# Patient Record
Sex: Male | Born: 1955 | Race: White | Hispanic: No | Marital: Married | Smoking: Former smoker
Health system: Southern US, Community
[De-identification: ages and names within clinical notes are randomized; demographics above are authoritative.]

## PROBLEM LIST (undated history)

## (undated) DIAGNOSIS — J939 Pneumothorax, unspecified: Secondary | ICD-10-CM

## (undated) DIAGNOSIS — C801 Malignant (primary) neoplasm, unspecified: Secondary | ICD-10-CM

## (undated) DIAGNOSIS — R911 Solitary pulmonary nodule: Secondary | ICD-10-CM

## (undated) DIAGNOSIS — Z923 Personal history of irradiation: Secondary | ICD-10-CM

## (undated) DIAGNOSIS — J449 Chronic obstructive pulmonary disease, unspecified: Secondary | ICD-10-CM

## (undated) HISTORY — PX: HERNIA REPAIR: SHX51

## (undated) HISTORY — DX: Personal history of irradiation: Z92.3

## (undated) HISTORY — PX: OTHER SURGICAL HISTORY: SHX169

## (undated) HISTORY — PX: APPENDECTOMY: SHX54

---
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
## 1979-09-13 HISTORY — PX: CHEST TUBE INSERTION: SHX231

## 1998-06-30 ENCOUNTER — Encounter: Payer: Self-pay | Admitting: Orthopedic Surgery

## 1998-06-30 ENCOUNTER — Observation Stay (HOSPITAL_COMMUNITY): Admission: RE | Admit: 1998-06-30 | Discharge: 1998-07-01 | Payer: Self-pay | Admitting: Orthopedic Surgery

## 2004-02-07 ENCOUNTER — Ambulatory Visit (HOSPITAL_COMMUNITY): Admission: RE | Admit: 2004-02-07 | Discharge: 2004-02-07 | Payer: Self-pay | Admitting: Orthopedic Surgery

## 2004-03-05 ENCOUNTER — Encounter: Admission: RE | Admit: 2004-03-05 | Discharge: 2004-03-05 | Payer: Self-pay | Admitting: Specialist

## 2004-04-13 ENCOUNTER — Inpatient Hospital Stay (HOSPITAL_COMMUNITY): Admission: RE | Admit: 2004-04-13 | Discharge: 2004-04-14 | Payer: Self-pay | Admitting: Specialist

## 2009-04-24 ENCOUNTER — Encounter: Admission: RE | Admit: 2009-04-24 | Discharge: 2009-04-24 | Payer: Self-pay | Admitting: Orthopedic Surgery

## 2009-04-30 ENCOUNTER — Encounter: Admission: RE | Admit: 2009-04-30 | Discharge: 2009-04-30 | Payer: Self-pay | Admitting: Orthopedic Surgery

## 2011-01-28 NOTE — Op Note (Signed)
NAME:  Dillon Olson, Dillon Olson                          ACCOUNT NO.:  [id number]   MEDICAL RECORD NO.:  [id number]                   PATIENT TYPE:  INP   LOCATION:  0471                                 FACILITY:  [facility]   PHYSICIAN:  Kerrin Champagne, M.D.                DATE OF BIRTH:  January 10, 1956   DATE OF PROCEDURE:  04/13/2004  DATE OF DISCHARGE:                                 OPERATIVE REPORT   PREOPERATIVE DIAGNOSES:  1. 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  2. Spondylosis left C6-7 and left C5-6.  3. Right-sided spondylosis C5-6.   POSTOPERATIVE DIAGNOSES:  1. 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  2. Spondylosis left C6-7 and left C5-6.  3. Right-sided spondylosis C5-6.   PROCEDURE:  1. Anterior cervical diskectomy with fusion of C5-6 and C6-7 with excision     of uncovertebral spurs bilateral C5-6 and bilateral C6-7.  2. Excision HNP right C6-7.  3. Internal fixation with a 43 mm DePuy 6-hole locking plate with 14 mm     screws.  4. Right iliac crest bone graft harvested through a separate incision   SURGEON:  Dr. Vira Browns   ASSISTANT:  Maud Deed, PA-C   ANESTHESIA:  GOT.  Dr. Leta Jungling.   ESTIMATED BLOOD LOSS:  50 mL.   DRAINS:  A 7 French drain, left neck to red-top tube, Foley to straight  drain.   BRIEF CLINICAL HISTORY:  The patient is a 55 year old male with a history of  neck pain, radiation into the right and left upper extremity.  He apparently  caught himself coming down a wet ladder in March of this year and began  experiencing pain into his neck with associated stiffness, radiation into  his right shoulder and arm.  The pain primarily into his ring and little  fingers.  He was seen and evaluated, placed on medications.  MRI scan  eventually done which showed disk protrusion of the right side of C6-7 with  impingement on the right C7 nerve root.  Spondylosis changes bilaterally at  C5-6.  Minimal spondylosis at C4-5.  The patient underwent attempts  at  conservative management in an attempt to return to work with persistent  neck, shoulder, and arm pain.  He has continued to require narcotic  medication.  The patient shows weakness in the C7 distribution including  finger extension and triceps weakness.  Findings on MRI scan as above.  He  is brought to the operating room to undergo a two-level anterior diskectomy  and fusion at both C5-6 and C6-7.   INTRAOPERATIVE FINDINGS:  The patient was found to have primarily  spondylosis changes bilaterally at C5-6 and C6-7, some amount of disk  material to the right side at C6-7 causing some compression of the entry  point of the C7 nerve root.   DESCRIPTION OF PROCEDURE:  After adequate general anesthesia, the patient in  a beach chair position,  five pounds cervical halter traction with a bolster  transverse at the level of the scapula.  This represented a rolled sheet.  A  bump under the right buttock to make the iliac crest prominent.  Five pounds  of cervical halter traction with the neck in slight extension was noted.  Standard preoperative antibiotics.  Standard prep with DuraPrep solution  over the anterior neck, right iliac crest.  Draped in the usual manner.  Iodine Vi-Drape was used, both for the iliac crest and the anterior neck.  The incision, in line with the patient's skin creased along the left side 2  fingerbreadths above the medial clavicle, through the skin and subcutaneous  layers using a 10 blade scalpel after infiltration with Marcaine 0.5% with  1:200,000 epinephrine.  The incision carried down to the level of the  superior fascia overlying the platysma layer.  This was incised in line with  the skin incision using electrocautery, bleeders controlled using  electrocautery.  Interval between the trachea as needed esophagus medially  and the carotid sheath laterally was then developed bluntly, first using  scissors and spreading in this plane.  The omohyoid vessel  identified and  divided.  Blunt finger dissection then used to carefully spread the plane  between the trachea, esophagus, and carotid sheath to the anterior aspect of  the cervical spine and the anterior prevertebral fascial layer.  Hand-held  Clowards inserted and electrocautery used to cauterize the prevertebral  fascia along the medial border of the longus colli muscle on the left side  at the expected C5-6 and C6-7 level.  Carotid tuberosity palpated.  Kitner  used to free the prevertebral fascia across the midline, spinal needles with  sheath left intact allowing only 1 cm to protrude, then inserted into the  disk at C5-6 and C6-7.  Intraoperative bilateral radiograph demonstrated the  needles at C5-6 and C6-7, expected levels.  Under direct visualization then  the lower needle was removed and a small portion of the disk incised with a  15 blade scalpel and pituitary used for excising this disk for continued  identification throughout the remainder of the case.  The upper needle then  removed and similarly incised, a pituitary used to excise some disk  material, continued its identification.  Hand-held Clowards to continue  retraction and Key elevator used to elevate the longus colli muscle on both  sides of the C5-6 and C6-7 levels, carefully exposing the anterior aspect of  the disk space from along both sides.  Bleeders controlled using  electrocautery.  McCullough retractor inserted for the blade beneath the  medial border of the longus colli muscle.  Exposing the anterior aspect of  the C6-7 level first.  Under loupe magnification then incision made into the  disk using a 15 blade scalpel and pituitary rongeurs.   Microcurette used to debride the endplates, both inferior aspect of C6 and  the superior aspect of C7.  Then 14 mm screw post inserted into vertebral  body of C6 and C7, distraction obtained across the intervertebral disk space.  Following debridement of the disk space  back to the posterior aspect  of the disk space and posterior annulus, operating room microscope draped  and brought into the field.  Note that while intraoperative lateral  radiograph was being obtained, incision was made over the right iliac crest,  and exposure was obtained for obtaining grafts from the right iliac crest  over the anterolateral aspect of the crest.  A 2.5  inch incision was used to  expose this area.  The incision with electrocautery onto the periosteum  superiorly and then subperiosteal dissection carried mediolateral.   Returning to the neck then, under the operating room microscope then the C6-  7 level further approached; 1 mm Kerrison used to excise posterior lip  osteophytes over the posterior-superior aspect of C7 and the posterior-  inferior aspect of C6 out to the uncovertebral joint.  Here, 1 mm Kerrison  was used to make initial entry and removal of spur, then 2 mm Kerrison's  used to resect the uncovertebral spurs, both the right side and left side.  Disk material noted to be present in subligamentous region on the right  side, and this was resected using both a combination of 1 and 2 mm  Kerrison's as well as pituitary rongeurs.  With this. then the cervical cord  was well seen. and both foramen appeared to be well decompressed, both the  right and left sides such that a nerve hook could be easily passed out the  neural foramen without difficulty.  The height of the intervertebral disk  space was measured using a sounder to 7 mm, with that measuring  approximately 18 mm.  High-speed bur was used to carefully remove  cartilaginous endplates down to bleeding bony endplate surfaces, both over  the inferior aspect of the C6 and the superior aspect of C7.  A 7 mm dual  oscillating saw was then used to incise crest on the right side, and this  was cut across its base with a quarter-inch osteotome, carefully tapering  the dimensions of the intervertebral disk  space, measured about 14-15 mm in  length.  Height of 7 mm.  Graft was then inserted and impacted into placed,  keyed deep to the anterior aspect of the cortex of the anterior portion of  the disk space at C6-7.  Distraction removed and the screw posts removed  from the C7 vertebral body, bone wax applied to the screw post hole.  The  McCullough retractor then re-placed at the C5-6 level, a screw post inserted  into the vertebral body of C5.  Distraction obtained across the disk space  and disk then incised anteriorly with a 15 blade scalpel, 3 mm and 2 mm  Kerrison used to debride anterior osteophytes and the anterior annulus,  micro pituitaries as well as microcurettes used to debride the __________  endplate in the inferior aspect of C5 and the superior aspect of the C7 back  to the posterior annulus.  Posterior annulus then debrided using curettage  as well as 1 mm and 2 mm Kerrison's.  Posterior lip osteophytes resected using 1 mm Kerrison's out to the neural foramen in both right and left side  where these osteophytes were then resected using 1 and 2 mm Kerrison's until  the C6 nerve root was noted to be exiting without further compromise.  Both  the right and left foramen were opened this and to provide decompression in  both C6 nerve roots.  Irrigation was performed, the endplates then carefully  curettaged using microcurettes and then high-speed bur down to bleeding  cortical endplates.  Height of the intervertebral disk space then measured  at 7 mm and depth measured at 18 mm using Cloward depth gauge.  Bleeding  controlled using bipolar electrocautery and thrombin-soaked Gelfoam.  Graft  then harvested from the right anterior iliac crest using a 7 mm dual  oscillating saw dividing the base with a quarter-inch  osteotome  appropriately.  This then tapered to the dimension of the intervertebral  disk space, measuring about 14-15 mm in depth.  Height of 7 mm.  Carefully  tapered  dimensions intervertebral disk space, and this was then impacted  into place, care taken to carefully mind the bone graft into correct  position alignment.  With this, then the distraction screw posts were  removed from the vertebral body of C5 and C6.  Five pounds cervical halter  traction released.   The expected length plate then measured, measuring about 40 mm between screw  holes and a 43 mm plate chosen.  Carefully, the anterior aspect of the  cervical spine including anterior lip osteophytes over the anterior aspect  of vertebral body C5 and C6 were carefully smoothed using a high-speed bur  to normal plane of the anterior aspect of vertebral body at each segment.  Bleeders controlled using electrocautery.  The 43 mm 6-hole plate then  placed against the anterior aspect of the cervical spine, carefully aligned,  the patient's chin placed in the midline.  A retaining pin then placed in  the vertebral body of C5, a retaining plate to the vertebral body here.  __________.  First screws were then placed at the C6 level, left side then  right side, 14 mm drill then using the appropriate size 14 mm screw, first  the left and the right side.  Then at the C5 level, left side, then the  right side as well, drilling with 14 mm drill, placing a 14 mm screw on the  left at C5 and then on the right similarly.  Finally at the C7 level,  similarly screws were placed, first on the left side at C7 and on the right  side.  The regular screw size 14 did not appear to capture the revision  screw.  The 14 mm depth did appear to capture on the right side.  Each of  the screws were then captured and locked into place using a locking  mechanism, turning the locking nut using appropriate size screwdriver at  each segment.  Intraoperative lateral radiograph demonstrated the bone plugs  in good position alignment, the plates and screws without evidence of  impingement of the spinal canal.  The position and  alignment of the plate and screws appeared to be quite good.  Irrigation was then performed.  Careful inspection of the esophagus demonstrated no abnormalities.  The 7  French drain then placed along the left side of the circle plate.  The  platysma layer then approximated with interrupted 3-0 Vicryl sutures, the  drain sewn in with a 4-0 nylon stitch.  The subcu layers approximated with  interrupted 3-0 Vicryl sutures, and the skin closed with a running subcu  stitch of 4-0 Vicryl.  Tincture of Benzoin and Steri-Strips applied to the  skin and right iliac crest bone graft harvest site, carefully hemostased  with bone wax, Gelfoam applied.  Periosteum then approximated over the iliac  crest with interrupted 0 Vicryl sutures, deep subcu layers approximated with  interrupted 2-0 Vicryl sutures and the skin closed with a running subcu  stitch of 4-0 Vicryl.  Tincture of Benzoin and Steri-Strips applied here.  Then 4 x 4's affixed to the skin with Hypafix tape, also with the neck 4 x 4  affixed to the skin with Hypafix tape.  TLS drain charged using a red-top  tube.  Philadelphia collar was applied, the patient then reactivated,  extubated, and returned  to the recovery room in satisfactory condition.  All  instrument and sponge counts were correct.                                               Kerrin Champagne, M.D.    Myra Rude  D:  04/13/2004  T:  04/13/2004  Job:  [id number]

## 2016-05-30 LAB — PULMONARY FUNCTION TEST

## 2016-07-06 NOTE — Progress Notes (Addendum)
Thoracic Location of Tumor / Histology: left upper lung nodule  Patient presented with a screening CT scan of his chest.  Biopsies revealed[facility] the [facility] of the nodule makes a biopsy very difficult.  Tobacco/Marijuana/Snuff/ETOH use: former smoker.  Quit 2 years ago.  Smoked 0.75 ppd for 39 years.  Denies marijuana/snuff/etoh use.  Past/Anticipated interventions by cardiothoracic surgery, if any: saw Dr. Whitman Hero who first recommended surgery but then call patient back and put him on Avelox to see if the nodule was from infection/inflamation.  He will have another CT scan in 3.5 weeks.  Past/Anticipated interventions by medical oncology, if any: no  Signs/Symptoms  Weight changes, if any: no - but does report having a poor appetite.  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  He takes percocet 3 times a day.  SAFETY ISSUES:  Prior radiation? no  Pacemaker/ICD? no   Possible current pregnancy?no  Is the patient on methotrexate? no  Current Complaints / other details:  Patient is here with his wife.  BP 133/79 (BP Location: Right Arm, Patient Position: Sitting)   Pulse (!) 57   Temp 98.3 F (36.8 C) (Oral)   Ht '5\' 5"'$  (1.651 m)   Wt 101 lb 9.6 oz (46.1 kg)   SpO2 100%   BMI 16.91 kg/m    Wt Readings from Last 3 Encounters:  07/07/16 101 lb 9.6 oz (46.1 kg)

## 2016-07-07 ENCOUNTER — Ambulatory Visit
Admission: RE | Admit: 2016-07-07 | Discharge: 2016-07-07 | Disposition: A | Discharge status: Home / Self Care | Payer: Non-veteran care | Source: Ambulatory Visit | Attending: Radiation Oncology | Admitting: Radiation Oncology

## 2016-07-07 ENCOUNTER — Encounter: Payer: Self-pay | Admitting: Radiation Oncology

## 2016-07-07 DIAGNOSIS — M545 Low back pain: Secondary | ICD-10-CM | POA: Diagnosis not present

## 2016-07-07 DIAGNOSIS — R911 Solitary pulmonary nodule: Secondary | ICD-10-CM | POA: Diagnosis present

## 2016-07-07 DIAGNOSIS — Z87891 Personal history of nicotine dependence: Secondary | ICD-10-CM | POA: Diagnosis not present

## 2016-07-07 DIAGNOSIS — J9811 Atelectasis: Secondary | ICD-10-CM | POA: Insufficient documentation

## 2016-07-07 DIAGNOSIS — Z510 Encounter for antineoplastic radiation therapy: Secondary | ICD-10-CM | POA: Insufficient documentation

## 2016-07-07 DIAGNOSIS — G8929 Other chronic pain: Secondary | ICD-10-CM | POA: Diagnosis not present

## 2016-07-07 DIAGNOSIS — J449 Chronic obstructive pulmonary disease, unspecified: Secondary | ICD-10-CM | POA: Diagnosis not present

## 2016-07-07 DIAGNOSIS — J439 Emphysema, unspecified: Secondary | ICD-10-CM | POA: Diagnosis not present

## 2016-07-07 HISTORY — DX: Solitary pulmonary nodule: R91.1

## 2016-07-07 NOTE — Progress Notes (Addendum)
Radiation Oncology         [phone] ________________________________  Initial Outpatient Consultation  Name: Dillon Olson MRN: [id number]  Date: 07/07/2016  DOB: [birth year]  CC:No primary care provider on file.  No ref. provider found   REFERRING PHYSICIAN: No ref. provider found  DIAGNOSIS: The encounter diagnosis was Solitary pulmonary nodule.   Clinical Stage 1A presumably [id number] non small cell lung cancer  HISTORY OF PRESENT ILLNESS::Dillon Olson is a 60 y.o. male  who is seen out courtesy of Dr. Debbe Bales for consideration for SBRT as management of the patient's solitary pulmonary nodule.  The patient is on disability secondary to back injury. He previously was an Clinical biochemist. Also a history of  COPD, depression who was found to have a 1.3 cm LUL nodule suspicious for lung cancer during his screening. He received PET CT at [facility] and thoracic surgery was consulted as well and seen on 06/16/16 (Dr. Robynn Pane). Hypermetabolic left upper lobe nodule measures approximately 1.1x0.9 cm and has an SUV of 4.8. Mild nonspecific uptake within mediastinal and hilar lymph nodes which may be seen in patient with a history of smoking. Mild uptake within an area of scarring with in the right upper lung. No abnormal FDG uptake is seen in the heart, pleura, esophagus, axilla, or breasts. Ancillary chest CT findings: Advanced destructive emphysema with large biapical bulla, right greater than left. Mild bibasilar atelectasis/scarring. No additional pulmonary nodules are identified. Impression was hypermetabolic left upper lobe pulmonary nodule concerning for primary lung malignancy. No definitive evidence of metastatic disease.  His case was reviewed at Thoracic Tumor Board and per note, it was discussed that he has this hypermetabolic left upper lobe nodule in a background of severe emphysema and can not rule out an infection/inflammatory process. Plan was for empiric antibody treatment  and repeat CT chest in 6-8 weeks which patient had agreed with.     PREVIOUS RADIATION THERAPY: No  PAST MEDICAL HISTORY:  has a past medical history of Lung nodule.    PAST SURGICAL HISTORY: Past Surgical History:  Procedure Laterality Date  . APPENDECTOMY    . CHEST TUBE INSERTION  1981   collapsed left lung  . HERNIA REPAIR     x3  . lower back     L1 and L5  . neck fusion      FAMILY HISTORY: family history includes Lung cancer in his father.  SOCIAL HISTORY:  reports that he quit smoking about 2 years ago. His smoking use included Cigarettes. He started smoking about 39 years ago. He smoked 0.75 packs per day. He has never used smokeless tobacco. He reports that he does not drink alcohol or use drugs.  ALLERGIES: Codeine  MEDICATIONS:  Current Outpatient Prescriptions  Medication Sig Dispense Refill  . cyclobenzaprine (FLEXERIL) 10 MG tablet Take 10 mg by mouth at bedtime.    . gabapentin (NEURONTIN) 100 MG capsule Take 100 mg by mouth at bedtime.    . Nutritional Supplements (ENSURE) POWD Take by mouth.    . oxyCODONE-acetaminophen (PERCOCET) 10-325 MG tablet Take 1 tablet by mouth every 8 (eight) hours as needed for pain. Takes 3 times a day.     No current facility-administered medications for this encounter.     REVIEW OF SYSTEMS:  A 15 point review of systems is documented in the electronic medical record. This was obtained by the nursing staff. However, I reviewed this with the patient to discuss relevant findings and make  appropriate changes.   Patient is positive for poor appetite, and chronic back pain in the lumbar spine. Patient denies weight changes, respiratory abnormalities, hemoptysis.   PHYSICAL EXAM:  height is '5\' 5"'$  (1.651 m) and weight is 101 lb 9.6 oz (46.1 kg). His oral temperature is 98.3 F (36.8 C). His blood pressure is 133/79 and his pulse is 57 (abnormal). His oxygen saturation is 100%.   General: Alert and oriented, in no acute distress,  Accompanied by his wife on evaluation today HEENT: Head is normocephalic. Extraocular movements are intact. Oropharynx is clear. Top and bottom dentures noted. Bilateral hearing aids noted. Neck: Neck is supple, no palpable cervical or supraclavicular lymphadenopathy. Heart: Regular in rate and rhythm with no murmurs, rubs, or gallops. Chest: Clear to auscultation bilaterally, with no rhonchi, wheezes, or rales. Lipoma on left lateral chest wall 3x2.5 cm in size. Abdomen: Soft, nontender, nondistended, with no rigidity or guarding.  Extremities: No cyanosis or edema. Lymphatics: see Neck Exam Skin: No concerning lesions. Musculoskeletal: symmetric strength and muscle tone throughout. Neurologic: Cranial nerves II through XII are grossly.intact.  Mild weakness in left lower extremity,  proximal muscle groups. No obvious focalities. Speech is fluent. Coordination is intact. Psychiatric: Judgment and insight are intact. Affect is appropriate.    ECOG = 1  LABORATORY DATA:  No results found for: WBC, HGB, HCT, MCV, PLT, NEUTROABS No results found for: NA, K, CL, CO2, GLUCOSE, CREATININE, CALCIUM    RADIOGRAPHY: Reports from the [facility]  reviewed, imaging not available at this time, images requested    IMPRESSION: Clinical stage IA presumably non small cell lung cancer. Patient is not a good candidate for biopsy based on location and his emphysema. Patient has been seen by thoracic surgery and would not be an ideal candidate for surgery given his lung situation and location of lesion.  He would be a good candidate for SBRT but we would need to look at his images to determine its location within the lung. Per reports from his medical oncologist, the lesion may be infection and the patient has been on antibiotics. He will have repeat Chest CT approximately 4 weeks to assess this issue. If the lesion persists, he will return to radiation oncology for treatment.  PLAN: Re-evaluation after repeat chest  CT scan.   ------------------------------------------------  Blair Promise, PhD, MD  This document serves as a record of services personally performed by Gery Pray, MD. It was created on his behalf by Bethann Humble, a trained medical scribe. The creation of this record is based on the scribe's personal observations and the provider's statements to them. This document has been checked and approved by the attending provider.

## 2016-07-12 NOTE — Addendum Note (Signed)
Encounter addended by: Jacqulyn Liner, RN on: 07/12/2016  8:08 AM<BR>    Actions taken: Charge Capture section accepted

## 2016-07-19 ENCOUNTER — Telehealth: Payer: Self-pay | Admitting: *Deleted

## 2016-07-19 NOTE — Telephone Encounter (Signed)
On 07-19-16 [facility].

## 2016-07-29 NOTE — Progress Notes (Signed)
Thoracic Location of Tumor / Histology: left upper lung nodule  Patient presented with a screening CT scan of his chest.  Biopsies revealed: [facility] the [facility] of the nodule makes a biopsy very difficult.  Tobacco/Marijuana/Snuff/ETOH use: former smoker.  Quit 2 years ago.  Smoked 0.75 ppd for 39 years.  Denies marijuana/snuff/etoh use.  Past/Anticipated interventions by cardiothoracic surgery, if any: saw Dr. Whitman Hero who first recommended surgery but then called patient back and put him on Avelox to see if the nodule was from infection/inflammation.  He will have another CT scan in 3.5 weeks.  Past/Anticipated interventions by medical oncology, if any: no  Signs/Symptoms  Weight changes, if any: no - but does report having a poor appetite.  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  He takes percocet 3 times a day.  SAFETY ISSUES:  Prior radiation? no  Pacemaker/ICD? no   Possible current pregnancy?no  Is the patient on methotrexate? no  Current Complaints / other details:  CT done on 07/18/16 which showed "unchanged 1.3 cm spiculated nodule of the left upper lobe concerning for primary bronchogenic carcinoma."  Patient forgot his CD at home.  He will bring it in on Monday.  BP 133/74 (BP Location: Right Arm, Patient Position: Sitting)   Pulse 68   Temp 97.7 F (36.5 C) (Oral)   Ht '5\' 5"'$  (1.651 m)   Wt 103 lb 6.4 oz (46.9 kg)   SpO2 96%   BMI 17.21 kg/m    Wt Readings from Last 3 Encounters:  08/03/16 103 lb 6.4 oz (46.9 kg)  07/07/16 101 lb 9.6 oz (46.1 kg)

## 2016-08-03 ENCOUNTER — Encounter: Payer: Self-pay | Admitting: Radiation Oncology

## 2016-08-03 ENCOUNTER — Ambulatory Visit
Admission: RE | Admit: 2016-08-03 | Discharge: 2016-08-03 | Disposition: A | Discharge status: Home / Self Care | Payer: Non-veteran care | Source: Ambulatory Visit | Attending: Radiation Oncology | Admitting: Radiation Oncology

## 2016-08-03 DIAGNOSIS — Z510 Encounter for antineoplastic radiation therapy: Secondary | ICD-10-CM | POA: Diagnosis not present

## 2016-08-03 DIAGNOSIS — R911 Solitary pulmonary nodule: Secondary | ICD-10-CM

## 2016-08-03 NOTE — Progress Notes (Signed)
Please see the Nurse Progress Note in the MD Initial Consult Encounter for this patient. 

## 2016-08-03 NOTE — Progress Notes (Signed)
Radiation Oncology         [phone] ________________________________  Name: Dillon Olson MRN: [id number]  Date: 08/03/2016  DOB: [birth year]  Follow-Up Visit Note  CC: Debbe Bales, [facility]    ICD-9-CM ICD-10-CM   1. Solitary pulmonary nodule 793.11 R91.1     Diagnosis:   Solitary left pulmonary nodule presenting in the left upper lobe  Clinical Stage 1A presumably T1aN0M0 non-small cell lung cancer  Narrative:  The patient returns today for reevaluation. Since his initial October 26 the patient is been on antibiotic therapy throughout the infection as the etiology of his pulmonary nodule. Patient did undergo a repeat chest CT scan at the [facility] clinic on November 6. The scan did show the 1.3 cm spiculated nodule in the left upper lobe to be unchanged and concerning for primary bronchogenic carcinoma. There is no hilar or mediastinal adenopathy appreciated. Patient was also noted to have a indeterminate 5 mm subpleural nodule in the right lower lobe.   The patient denies any weight changes, but does report having a poor appetite which is a chronic problem for him. He denies any respiratory complaints. Denies hemoptysis. 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               ALLERGIES:  is allergic to codeine.  Meds: Current Outpatient Prescriptions  Medication Sig Dispense Refill  . albuterol (PROVENTIL HFA;VENTOLIN HFA) 108 (90 Base) MCG/ACT inhaler Inhale into the lungs every 6 (six) hours as needed for wheezing or shortness of breath.    . cyclobenzaprine (FLEXERIL) 10 MG tablet Take 10 mg by mouth at bedtime.    . gabapentin (NEURONTIN) 100 MG capsule Take 100 mg by mouth at bedtime.    . Nutritional Supplements (ENSURE) POWD Take by mouth.    . oxyCODONE-acetaminophen (PERCOCET) 10-325 MG tablet Take 1 tablet by mouth every 8 (eight) hours as needed for pain. Takes 3 times a day.     No  current facility-administered medications for this encounter.     Physical Findings: The patient is in no acute distress. Patient is alert and oriented.  height is '5\' 5"'$  (1.651 m) and weight is 103 lb 6.4 oz (46.9 kg). His oral temperature is 97.7 F (36.5 C). His blood pressure is 133/74 and his pulse is 68. His oxygen saturation is 96%.  No significant changes. General: Alert and oriented, in no acute distress Heart: Regular in rate and rhythm with no murmurs, rubs, or gallops. Chest: Clear to auscultation bilaterally, with no rhonchi, wheezes, or rales. No palpable supraclavicular or axillary adenopathy  Lab Findings: No results found for: WBC, HGB, HCT, MCV, PLT  Radiographic Findings: Reviewed as above in the history of present illness  Impression:  This is a 60 year old well appearing male with a PET positive solitary pulmonary nodule in left upper lung. The patient has undergone repeat chest CT scan after his antibiotic therapy at the [facility] location, but he unfortunately forgot the bring the CD  with him today. Patient will bring the CD back early next week for review and after review of the CT will then be set up for his SBRT for solitary PET positive pulmonary nodule.  Plan: We discussed the risks, benefits, and side effects of radiation therapy. We discussed the logistics of radiation therapy for the patient's education. The patient would like to move forward with radiation therapy and will be  scheduled for SBRT planning and treatment.   -----------------------------------  Blair Promise, PhD, MD  This document serves as a record of services personally performed by Gery Pray, MD. It was created on his behalf by Maryla Morrow, a trained medical scribe. The creation of this record is based on the scribe's personal observations and the provider's statements to them. This document has been checked and approved by the attending provider.

## 2016-08-16 ENCOUNTER — Ambulatory Visit
Admission: RE | Admit: 2016-08-16 | Discharge: 2016-08-16 | Disposition: A | Discharge status: Home / Self Care | Payer: Non-veteran care | Source: Ambulatory Visit | Attending: Radiation Oncology | Admitting: Radiation Oncology

## 2016-08-16 DIAGNOSIS — Z510 Encounter for antineoplastic radiation therapy: Secondary | ICD-10-CM | POA: Diagnosis not present

## 2016-08-16 DIAGNOSIS — R911 Solitary pulmonary nodule: Secondary | ICD-10-CM

## 2016-08-17 NOTE — Progress Notes (Signed)
  Radiation Oncology         [phone] ________________________________  Name: Dillon Olson MRN: [id number]  Date: 08/16/2016  DOB: 10/16/1955   STEREOTACTIC BODY RADIOTHERAPY SIMULATION AND TREATMENT PLANNING NOTE    DIAGNOSIS:  Solitary left pulmonary nodule presenting in the left upper lobe  Clinical Stage 1A presumably T1aN0M0 non-small cell lung cancer  NARRATIVE:  The patient was brought to the [facility].  Identity was confirmed.  All relevant records and images related to the planned course of therapy were reviewed.  The patient freely provided informed written consent to proceed with treatment after reviewing the details related to the planned course of therapy. The consent form was witnessed and verified by the simulation staff.  Then, the patient was set-up in a stable reproducible  supine position for radiation therapy.  A BodyFix immobilization pillow was fabricated for reproducible positioning.  Then I personally applied the abdominal compression paddle to limit respiratory excursion.  4D respiratoy motion management CT images were obtained.  Surface markings were placed.  The CT images were loaded into the planning software.  Then, using Cine, MIP, and standard views, the internal target volume (ITV) and planning target volumes (PTV) were delinieated, and avoidance structures were contoured.  Treatment planning then occurred.  The radiation prescription was entered and confirmed.  A total of two complex treatment devices were fabricated in the form of the BodyFix immobilization pillow and a neck accuform cushion.  I have requested : 3D Simulation  I have requested a DVH of the following structures: Heart, Lungs, Esophagus, Chest Wall, Brachial Plexus, Major Blood Vessels, and targets.  PLAN:  The patient will receive 54 Gy in 3 fractions.  -----------------------------------  Blair Promise, PhD, MD

## 2016-08-24 DIAGNOSIS — Z510 Encounter for antineoplastic radiation therapy: Secondary | ICD-10-CM | POA: Diagnosis not present

## 2016-08-29 ENCOUNTER — Ambulatory Visit
Admission: RE | Admit: 2016-08-29 | Discharge: 2016-08-29 | Disposition: A | Discharge status: Home / Self Care | Payer: Non-veteran care | Source: Ambulatory Visit | Attending: Radiation Oncology | Admitting: Radiation Oncology

## 2016-08-29 DIAGNOSIS — Z510 Encounter for antineoplastic radiation therapy: Secondary | ICD-10-CM | POA: Diagnosis not present

## 2016-08-29 DIAGNOSIS — R911 Solitary pulmonary nodule: Secondary | ICD-10-CM

## 2016-08-29 NOTE — Progress Notes (Signed)
  Radiation Oncology         [phone] ________________________________  Name: Dillon Olson MRN: [id number]  Date: 08/29/2016  DOB: July 11, 1956  Stereotactic Body Radiotherapy Treatment Procedure Note  NARRATIVE:  Dillon Olson was brought to the stereotactic radiation treatment machine and placed supine on the CT couch. The patient was set up for stereotactic body radiotherapy on the body fix pillow.  3D TREATMENT PLANNING AND DOSIMETRY:  The patient's radiation plan was reviewed and approved prior to starting treatment.  It showed 3-dimensional radiation distributions overlaid onto the planning CT.  The [facility] for the target structures as well as the organs at risk were reviewed. The documentation of this is filed in the radiation oncology EMR.  SIMULATION VERIFICATION:  The patient underwent CT imaging on the treatment unit.  These were carefully aligned to document that the ablative radiation dose would cover the target volume and maximally spare the nearby organs at risk according to the planned distribution.  SPECIAL TREATMENT PROCEDURE: Dillon Olson received high dose ablative stereotactic body radiotherapy to the planned target volume without unforeseen complications. Treatment was delivered uneventfully. The high doses associated with stereotactic body radiotherapy and the significant potential risks require careful treatment set up and patient monitoring constituting a special treatment procedure   STEREOTACTIC TREATMENT MANAGEMENT:  Following delivery, the patient was evaluated clinically. The patient tolerated treatment without significant acute effects, and was discharged to home in stable condition.    PLAN: Continue treatment as planned.  ________________________________  Blair Promise, PhD, MD   This document serves as a record of services personally performed by Gery Pray, MD. It was created on his behalf by Bethann Humble, a trained medical scribe. The creation of  this record is based on the scribe's personal observations and the provider's statements to them. This document has been checked and approved by the attending provider.

## 2016-08-31 ENCOUNTER — Ambulatory Visit
Admission: RE | Admit: 2016-08-31 | Discharge: 2016-08-31 | Disposition: A | Discharge status: Home / Self Care | Payer: Non-veteran care | Source: Ambulatory Visit | Attending: Radiation Oncology | Admitting: Radiation Oncology

## 2016-08-31 DIAGNOSIS — R911 Solitary pulmonary nodule: Secondary | ICD-10-CM

## 2016-08-31 DIAGNOSIS — Z510 Encounter for antineoplastic radiation therapy: Secondary | ICD-10-CM | POA: Diagnosis not present

## 2016-08-31 NOTE — Progress Notes (Signed)
  Radiation Oncology         [phone] ________________________________  Name: Dillon Olson MRN: [id number]  Date: 08/31/2016  DOB: 04-Jul-1956  Stereotactic Body Radiotherapy Treatment Procedure Note  NARRATIVE:  IVAAN LIDDY was brought to the stereotactic radiation treatment machine and placed supine on the CT couch. The patient was set up for stereotactic body radiotherapy on the body fix pillow.  3D TREATMENT PLANNING AND DOSIMETRY:  The patient's radiation plan was reviewed and approved prior to starting treatment.  It showed 3-dimensional radiation distributions overlaid onto the planning CT.  The [facility] for the target structures as well as the organs at risk were reviewed. The documentation of this is filed in the radiation oncology EMR.  SIMULATION VERIFICATION:  The patient underwent CT imaging on the treatment unit.  These were carefully aligned to document that the ablative radiation dose would cover the target volume and maximally spare the nearby organs at risk according to the planned distribution.  SPECIAL TREATMENT PROCEDURE: [name] received high dose ablative stereotactic body radiotherapy to the planned target volume without unforeseen complications. Treatment was delivered uneventfully. The high doses associated with stereotactic body radiotherapy and the significant potential risks require careful treatment set up and patient monitoring constituting a special treatment procedure   STEREOTACTIC TREATMENT MANAGEMENT:  Following delivery, the patient was evaluated clinically. The patient tolerated treatment without significant acute effects, and was discharged to home in stable condition.    PLAN: Continue treatment as planned.  ________________________________  Blair Promise, PhD, MD   This document serves as a record of services personally performed by Gery Pray, MD. It was created on his behalf by Bethann Humble, a trained medical scribe. The creation of  this record is based on the scribe's personal observations and the provider's statements to them. This document has been checked and approved by the attending provider.

## 2016-09-02 ENCOUNTER — Ambulatory Visit
Admission: RE | Admit: 2016-09-02 | Discharge: 2016-09-02 | Disposition: A | Discharge status: Home / Self Care | Payer: Non-veteran care | Source: Ambulatory Visit | Attending: Radiation Oncology | Admitting: Radiation Oncology

## 2016-09-02 ENCOUNTER — Inpatient Hospital Stay
Admission: RE | Admit: 2016-09-02 | Discharge: 2016-09-02 | Disposition: A | Discharge status: Home / Self Care | Payer: Self-pay | Source: Ambulatory Visit | Admitting: Radiation Oncology

## 2016-09-02 ENCOUNTER — Encounter: Payer: Self-pay | Admitting: Radiation Oncology

## 2016-09-02 VITALS — BP 150/80 | HR 58 | Temp 98.0°F | Ht 65.0 in | Wt 108.4 lb

## 2016-09-02 DIAGNOSIS — R911 Solitary pulmonary nodule: Secondary | ICD-10-CM

## 2016-09-02 DIAGNOSIS — Z510 Encounter for antineoplastic radiation therapy: Secondary | ICD-10-CM | POA: Diagnosis not present

## 2016-09-02 NOTE — Progress Notes (Signed)
  Radiation Oncology         [phone]) [phone] ________________________________  Name: Dillon Olson MRN: [id number]  Date: 09/02/2016  DOB: Jan 21, 1956  Stereotactic Body Radiotherapy Treatment Procedure Note  NARRATIVE:  Dillon Olson was brought to the stereotactic radiation treatment machine and placed supine on the CT couch. The patient was set up for stereotactic body radiotherapy on the body fix pillow.  3D TREATMENT PLANNING AND DOSIMETRY:  The patient's radiation plan was reviewed and approved prior to starting treatment.  It showed 3-dimensional radiation distributions overlaid onto the planning CT.  The [facility] for the target structures as well as the organs at risk were reviewed. The documentation of this is filed in the radiation oncology EMR.  SIMULATION VERIFICATION:  The patient underwent CT imaging on the treatment unit.  These were carefully aligned to document that the ablative radiation dose would cover the target volume and maximally spare the nearby organs at risk according to the planned distribution.  SPECIAL TREATMENT PROCEDURE: [name] received high dose ablative stereotactic body radiotherapy to the planned target volume without unforeseen complications. Treatment was delivered uneventfully. The high doses associated with stereotactic body radiotherapy and the significant potential risks require careful treatment set up and patient monitoring constituting a special treatment procedure   STEREOTACTIC TREATMENT MANAGEMENT:  Following delivery, the patient was evaluated clinically. The patient tolerated treatment without significant acute effects, and was discharged to home in stable condition.    PLAN: Continue treatment as planned.  ________________________________  Blair Promise, PhD, MD

## 2016-09-02 NOTE — Progress Notes (Signed)
Dillon Olson has completed treatment with 3 fractions to his left upper lung.  He reports having aching and burning pain in his left lung that started yesterday.  He is taking percocet which helped.  He denies having any shortness of breath.  He reports having an occasional dry cough.  He reports having fatigue.  BP (!) 150/80 (BP Location: Left Arm, Patient Position: Sitting)   Pulse (!) 58   Temp 98 F (36.7 C) (Oral)   Ht '5\' 5"'$  (1.651 m)   Wt 108 lb 6.4 oz (49.2 kg)   SpO2 100%   BMI 18.04 kg/m    Wt Readings from Last 3 Encounters:  09/02/16 108 lb 6.4 oz (49.2 kg)  08/03/16 103 lb 6.4 oz (46.9 kg)  07/07/16 101 lb 9.6 oz (46.1 kg)

## 2016-09-02 NOTE — Progress Notes (Signed)
  Radiation Oncology         (336) [phone] ________________________________  Name: Dillon Olson MRN: [id number]  Date: 09/02/2016  DOB: Nov 08, 1955  Weekly Radiation Therapy Management    ICD-9-CM ICD-10-CM   1. Solitary pulmonary nodule 793.11 R91.1      Current Dose: 54 Gy     Planned Dose:  54 Gy  Narrative . . . . . . . . The patient presents for routine under treatment assessment.                                   The patient is without complaint. He does note some mild fatigue and occasional discomfort along the left lateral chest.                                 Set-up films were reviewed.                                 The chart was checked. Physical Findings. . .  height is '5\' 5"'$  (1.651 m) and weight is 108 lb 6.4 oz (49.2 kg). His oral temperature is 98 F (36.7 C). His blood pressure is 150/80 (abnormal) and his pulse is 58 (abnormal). His oxygen saturation is 100%. . The lungs are clear. The heart has a regular rhythm and rate Impression . . . . . . . The patient is tolerating radiation. He has completed his SBRT. Plan . . . . . . . . . . . .   Routine follow-up in one month. Unsure at this time whether he will receive his CT scans through the [facility] or here at [facility].  ________________________________   Blair Promise, PhD, MD

## 2016-09-14 ENCOUNTER — Encounter: Payer: Self-pay | Admitting: Radiation Oncology

## 2016-09-14 NOTE — Progress Notes (Signed)
  Radiation Oncology         [phone]) [phone] ________________________________  Name: Dillon Olson MRN: [id number]  Date: 09/14/2016  DOB: 1956-08-23  End of Treatment Note  Diagnosis:   Solitary pulmonary nodule    Indication for treatment:  Curative      Radiation treatment dates:   08/29/16, 08/31/16, 09/02/16  Site/dose:   Left upper lung / 54 Gy in 3 fractions  Beams/energy:   SBRT SRT-3D / 6FFF  Narrative: The patient tolerated radiation treatment relatively well. Mild fatigue  Plan: The patient has completed radiation treatment. The patient will return to radiation oncology clinic for routine followup in one month. I advised them to call or return sooner if they have any questions or concerns related to their recovery or treatment.  -----------------------------------  Blair Promise, PhD, MD

## 2016-10-26 ENCOUNTER — Encounter: Payer: Self-pay | Admitting: Oncology

## 2016-10-27 ENCOUNTER — Encounter: Payer: Self-pay | Admitting: Radiation Oncology

## 2016-10-27 ENCOUNTER — Ambulatory Visit
Admission: RE | Admit: 2016-10-27 | Discharge: 2016-10-27 | Disposition: A | Discharge status: Home / Self Care | Payer: Non-veteran care | Source: Ambulatory Visit | Attending: Radiation Oncology | Admitting: Radiation Oncology

## 2016-10-27 VITALS — BP 132/71 | HR 66 | Temp 98.2°F | Ht 65.0 in | Wt 110.0 lb

## 2016-10-27 DIAGNOSIS — R062 Wheezing: Secondary | ICD-10-CM | POA: Insufficient documentation

## 2016-10-27 DIAGNOSIS — Y842 Radiological procedure and radiotherapy as the cause of abnormal reaction of the patient, or of later complication, without mention of misadventure at the time of the procedure: Secondary | ICD-10-CM | POA: Diagnosis not present

## 2016-10-27 DIAGNOSIS — Z885 Allergy status to narcotic agent status: Secondary | ICD-10-CM | POA: Diagnosis not present

## 2016-10-27 DIAGNOSIS — Z79899 Other long term (current) drug therapy: Secondary | ICD-10-CM | POA: Diagnosis not present

## 2016-10-27 DIAGNOSIS — R911 Solitary pulmonary nodule: Secondary | ICD-10-CM | POA: Insufficient documentation

## 2016-10-27 DIAGNOSIS — R5383 Other fatigue: Secondary | ICD-10-CM | POA: Diagnosis not present

## 2016-10-27 NOTE — Progress Notes (Signed)
  Radiation Oncology         [phone] ________________________________  Name: Dillon Olson MRN: [id number]  Date: 10/27/2016  DOB: 04-Aug-1956    Follow-Up Visit Note  CC: No primary care provider on file.  No ref. provider found    ICD-9-CM ICD-10-CM   1. Solitary pulmonary nodule 793.11 R91.1     Diagnosis:   Solitary pulmonary nodule  Interval Since Last Radiation:  7 weeks ; SBRT 54 Gy in 3 fractions to the Left upper lung 08/29/16, 08/31/16, 09/02/16  Narrative:  The patient returns today for routine follow-up.  He reports having pain in his lower back and neck which is chronic. He also has occasional sharp pains in his left chest that stop when he takes a deep breath. He denies having a cough or shortness of breath. He reports dry skin after treatment. He reports having fatigue. He reports gaining some weight secondary to fatigue. He will have a scan on 12/07/16 at the [facility] in [place].                               ALLERGIES:  is allergic to codeine.  Meds: Current Outpatient Prescriptions  Medication Sig Dispense Refill  . albuterol (PROVENTIL HFA;VENTOLIN HFA) 108 (90 Base) MCG/ACT inhaler Inhale into the lungs every 6 (six) hours as needed for wheezing or shortness of breath.    . cyclobenzaprine (FLEXERIL) 10 MG tablet Take 10 mg by mouth at bedtime.    . ENSURE PLUS (ENSURE PLUS) LIQD Take 1 Can by mouth.    . oxyCODONE-acetaminophen (PERCOCET) 10-325 MG tablet Take 1 tablet by mouth every 8 (eight) hours as needed for pain. Takes 3 times a day.     No current facility-administered medications for this encounter.     Physical Findings: The patient is in no acute distress. Patient is alert and oriented.  height is '5\' 5"'$  (1.651 m) and weight is 110 lb (49.9 kg). His oral temperature is 98.2 F (36.8 C). His blood pressure is 132/71 and his pulse is 66. His oxygen saturation is 98%. .   No significant changes. Lungs are clear to auscultation bilaterally. Heart has  regular rate and rhythm. No palpable cervical, supraclavicular, or axillary adenopathy. Abdomen soft, non-tender, normal bowel sounds.   Lab Findings: No results found for: WBC, HGB, HCT, MCV, PLT  Radiographic Findings: No results found.  Impression:  The patient is recovering from the effects of radiation.  Clinically stable, only side effect after treatment was fatigue.  Plan:  He will have a scan on 12/07/16 at the [facility] in [place]. He will return for follow up in 6 months.  -----------------------------------  Blair Promise, PhD, MD  This document serves as a record of services personally performed by Gery Pray, MD. It was created on his behalf by Arlyce Harman, a trained medical scribe. The creation of this record is based on the scribe's personal observations and the provider's statements to them. This document has been checked and approved by the attending provider.

## 2016-10-27 NOTE — Progress Notes (Signed)
Dillon Olson is here for follow up.  He reports having pain in his lower back and neck which is chronic.  He also has occasional sharp pains in his left chest that stop when he takes a deep breath.  He denies having a cough or shortness of breath.  He reports having fatigue.  He will have a scan on 12/07/16 at the [facility] in [place].  BP 132/71 (BP Location: Left Arm, Patient Position: Sitting)   Pulse 66   Temp 98.2 F (36.8 C) (Oral)   Ht '5\' 5"'$  (1.651 m)   Wt 110 lb (49.9 kg)   SpO2 98%   BMI 18.30 kg/m    Wt Readings from Last 3 Encounters:  10/27/16 110 lb (49.9 kg)  09/02/16 108 lb 6.4 oz (49.2 kg)  08/03/16 103 lb 6.4 oz (46.9 kg)

## 2016-12-14 ENCOUNTER — Telehealth: Payer: Self-pay | Admitting: Oncology

## 2016-12-14 NOTE — Telephone Encounter (Signed)
Patient called and wanted to make sure that Dr. Sondra Come received his chest x ray report.  He said that he has a new "spot" and wants Dr. Sondra Come to take a look at the report.

## 2017-04-27 ENCOUNTER — Ambulatory Visit
Admission: RE | Admit: 2017-04-27 | Discharge: 2017-04-27 | Disposition: A | Discharge status: Home / Self Care | Payer: Non-veteran care | Source: Ambulatory Visit | Attending: Radiation Oncology | Admitting: Radiation Oncology

## 2017-04-27 ENCOUNTER — Encounter: Payer: Self-pay | Admitting: Radiation Oncology

## 2017-04-27 VITALS — BP 146/81 | HR 71 | Temp 97.8°F | Resp 18 | Wt 94.2 lb

## 2017-04-27 DIAGNOSIS — Z923 Personal history of irradiation: Secondary | ICD-10-CM | POA: Diagnosis not present

## 2017-04-27 DIAGNOSIS — R911 Solitary pulmonary nodule: Secondary | ICD-10-CM | POA: Insufficient documentation

## 2017-04-27 NOTE — Progress Notes (Signed)
Dillon Olson is here today for 6 month follow up for Lung SBRT.  Patient reports having pain in the left chest that occurs when he takes a deep breathe.  He states that it started last Monday.  He states it does not occur when he is talking,or coughing, only when he takes a deep breathe.  It is waking him up at night.  Patient is taking Oxycodone for back pain and it is also helping with the chest pain.  He denies having lifting anything heavy or straining.  Patient reports having shortness of breath with exertion.  He is physically active, walks his dogs daily.  Patient reports nausea that his the [facility] oncologist gave him but he is unfamiliar with the name of that medication.  Patient brought recent CT Thorax films on CD.  (Patient needs this film returned to him after we scan for our records).  Patient reports poor appetite, stating that he only eats one meal a day and drinks 1 ensure.  Unable to tolerate more than 1 ensure daily, as it causes him to have upset stomach.  Patient has lost a great deal of weight since last visit. 14 lbs.    Denies coughing, denies any difficulty swallowing.  Patient reports after eating he gets hiccups.    Vitals:   04/27/17 0910  BP: (!) 146/81  Pulse: 71  Resp: 18  Temp: 97.8 F (36.6 C)  TempSrc: Oral  SpO2: 100%  Weight: 94 lb 3.2 oz (42.7 kg)   Wt Readings from Last 3 Encounters:  04/27/17 94 lb 3.2 oz (42.7 kg)  10/27/16 110 lb (49.9 kg)  09/02/16 108 lb 6.4 oz (49.2 kg)

## 2017-04-27 NOTE — Progress Notes (Signed)
Radiation Oncology         [phone]) [phone] ________________________________  Name: Dillon Olson MRN: [id number]  Date: 04/27/2017  DOB: 1956/01/04    Follow-Up Visit Note  CC: System, Pcp Not In  Leeann Must, MD    ICD-10-CM   1. Solitary pulmonary nodule R91.1     Diagnosis:   Solitary pulmonary nodule  Interval Since Last Radiation:   8 months ; SBRT 54 Gy in 3 fractions to the Left upper lung 08/29/16, 08/31/16, 09/02/16  Narrative:  The patient returns today for routine follow-up for lung SBRT. Patient reports having pain in the left chest that occurs when he takes a deep breath. He states that it started last Monday. He states it does not occur when he is talking, or coughing, only when he takes a deep breath. It is waking him up at night. It is decreased if he sleeps on his left side. Patient is taking oxycodone for back pain and it is also helping with the chest pain. The chest pain has been decreasing over time. He denies fever or chills. He denies having lifted anything heavy or straining. Patient reports having shortness of breath with exertion. He is physically active, walks his dogs daily. Patient reports nausea that his [place] oncologist gave him medication for but he is unfamiliar with the name of the medication. Patient brought recent CT Thorax films on CD (patient needs this film returned to him after we scan for our records). Patient reports poor appetite, stating that he only eats one meal a day and drink 1 ensure. Unable to tolerate more than 1 ensure daily, as it causes him to have upset stomach. Patient has lost a great deal of weight since last visit, 14 lbs. He admits to being under a lot of stress which affects his appetite.                    ALLERGIES:  is allergic to codeine.  Meds: Current Outpatient Prescriptions  Medication Sig Dispense Refill  . albuterol (PROVENTIL HFA;VENTOLIN HFA) 108 (90 Base) MCG/ACT inhaler Inhale into the lungs every 6 (six) hours as  needed for wheezing or shortness of breath.    . cyclobenzaprine (FLEXERIL) 10 MG tablet Take 10 mg by mouth at bedtime.    . ENSURE PLUS (ENSURE PLUS) LIQD Take 1 Can by mouth.    . oxyCODONE-acetaminophen (PERCOCET) 10-325 MG tablet Take 1 tablet by mouth every 8 (eight) hours as needed for pain. Takes 3 times a day.     No current facility-administered medications for this encounter.     Physical Findings: The patient is in no acute distress. Patient is alert and oriented.  weight is 94 lb 3.2 oz (42.7 kg). His oral temperature is 97.8 F (36.6 C). His blood pressure is 146/81 (abnormal) and his pulse is 71. His respiration is 18 and oxygen saturation is 100%. .   No significant changes. Lungs are clear to auscultation bilaterally. Heart has regular rate and rhythm. No palpable cervical, supraclavicular, or axillary adenopathy. Abdomen soft, non-tender, normal bowel sounds.    Lab Findings: No results found for: WBC, HGB, HCT, MCV, PLT  Radiographic Findings: No results found.  Impression:   Clinically stable. Most recent chest CT scan from the [facility] shows continued shrinkage of his solitary pulmonary nodule without any new problems.  Plan:  He will return for follow up in our clinic in 6 months. He is scheduled for repeat chest scan at  the [place] on December 12th. He will continue to follow at the [place] in the interim. Patient is planning on calling his [facility] physician's concerning his left-sided chest pain. This however is improving and not associated with shortness of breath.  -----------------------------------  Blair Promise, PhD, MD  This document serves as a record of services personally performed by Gery Pray, MD. It was created on his behalf by Arlyce Harman, a trained medical scribe. The creation of this record is based on the scribe's personal observations and the provider's statements to them. This document has been checked and approved by the attending  provider.

## 2017-05-03 ENCOUNTER — Telehealth: Payer: Self-pay

## 2017-05-03 NOTE — Telephone Encounter (Signed)
Nutrition  Patient identified on Malnutrition Screening Report for weight loss and poor appetite.  Called and left message on home voicemail to return call.  Mareo Portilla B. Zenia Resides, [place], [place] Registered Dietitian [phone] (pager)

## 2017-09-01 ENCOUNTER — Ambulatory Visit
Admission: RE | Admit: 2017-09-01 | Discharge: 2017-09-01 | Disposition: A | Discharge status: Home / Self Care | Payer: Non-veteran care | Source: Ambulatory Visit | Attending: Radiation Oncology | Admitting: Radiation Oncology

## 2017-09-01 ENCOUNTER — Other Ambulatory Visit: Payer: Self-pay | Admitting: Radiation Oncology

## 2017-09-01 DIAGNOSIS — R911 Solitary pulmonary nodule: Secondary | ICD-10-CM

## 2017-09-14 ENCOUNTER — Encounter: Payer: Self-pay | Admitting: Radiation Oncology

## 2017-09-14 ENCOUNTER — Other Ambulatory Visit: Payer: Self-pay

## 2017-09-14 ENCOUNTER — Ambulatory Visit
Admission: RE | Admit: 2017-09-14 | Discharge: 2017-09-14 | Disposition: A | Discharge status: Home / Self Care | Payer: Non-veteran care | Source: Ambulatory Visit | Attending: Radiation Oncology | Admitting: Radiation Oncology

## 2017-09-14 DIAGNOSIS — R911 Solitary pulmonary nodule: Secondary | ICD-10-CM | POA: Diagnosis present

## 2017-09-14 DIAGNOSIS — Z79899 Other long term (current) drug therapy: Secondary | ICD-10-CM | POA: Diagnosis not present

## 2017-09-14 DIAGNOSIS — Z885 Allergy status to narcotic agent status: Secondary | ICD-10-CM | POA: Diagnosis not present

## 2017-09-14 NOTE — Progress Notes (Signed)
Dillon Olson is here for follow up and to review his recent scans from the [place].  He denies having any pain today.  He also denies having shortness of breath except for an episode about 3 weeks ago when he was walking.  He has not had any trouble breathing since.  He denies having a cough.  He reports having a poor energy level.  He will have another CT scan in February with contract.  BP (!) 145/81 (BP Location: Left Arm, Patient Position: Sitting)   Pulse 63   Temp 98.2 F (36.8 C) (Oral)   Ht 5\' 5"  (1.651 m)   Wt 109 lb 6.4 oz (49.6 kg)   SpO2 100%   BMI 18.21 kg/m    Wt Readings from Last 3 Encounters:  09/14/17 109 lb 6.4 oz (49.6 kg)  04/27/17 94 lb 3.2 oz (42.7 kg)  10/27/16 110 lb (49.9 kg)

## 2017-09-14 NOTE — Progress Notes (Signed)
Radiation Oncology         [phone] ________________________________  Name: Dillon Olson MRN: [id number]  Date: 09/14/2017  DOB: 05/28/1956  Follow-Up Visit Note  CC: System, Pcp Not In  Leeann Must, MD    ICD-10-CM   1. Solitary pulmonary nodule R91.1     Diagnosis:  Solitary pulmonary nodule presenting in the left upper  Interval Since Last Radiation:  13 months   Radiation treatment dates:   08/29/16, 08/31/16, 09/02/16  Site/dose:   Left upper lung / 54 Gy in 3 fractions  Beams/energy:   SBRT SRT-3D / 6FFF   Narrative:  The patient returns today for routine follow-up and to review his recent scans from the [place].  He denies having any pain today.  He also denies having shortness of breath except for an episode about 3 weeks ago when he was walking.  He has not had any trouble breathing since.  He denies having a cough.  He reports having a poor energy level.  He will have another CT scan in February. He denies any new headaches or bony pain.                                 ALLERGIES:  is allergic to codeine.  Meds: Current Outpatient Medications  Medication Sig Dispense Refill  . cyclobenzaprine (FLEXERIL) 10 MG tablet Take 10 mg by mouth at bedtime.    . ENSURE PLUS (ENSURE PLUS) LIQD Take 1 Can by mouth.    . oxyCODONE-acetaminophen (PERCOCET) 10-325 MG tablet Take 1 tablet by mouth every 8 (eight) hours as needed for pain. Takes 3 times a day.    . albuterol (PROVENTIL HFA;VENTOLIN HFA) 108 (90 Base) MCG/ACT inhaler Inhale into the lungs every 6 (six) hours as needed for wheezing or shortness of breath.     No current facility-administered medications for this encounter.     Physical Findings: The patient is in no acute distress. Patient is alert and oriented.  height is 5\' 5"  (1.651 m) and weight is 109 lb 6.4 oz (49.6 kg). His oral temperature is 98.2 F (36.8 C). His blood pressure is 145/81 (abnormal) and his pulse is 63. His oxygen saturation is 100%. .   Lungs are clear to auscultation bilaterally. Heart has regular rate and rhythm. No palpable cervical, supraclavicular, or axillary adenopathy. Abdomen soft, non-tender, normal bowel sounds.  Lab Findings: No results found for: WBC, HGB, HCT, MCV, PLT  Radiographic Findings: Ct Outside Films Chest  Result Date: 09/01/2017 This examination belongs to an outside facility and is stored here for comparison purposes only.  Contact the originating outside institution for any associated report or interpretation.  Patient's chest CT scan on December 8 in [facility] demonstrated a evolving radiation changes in the left upper lobe. There was a area currently demonstrating band like scarring extending in AP direction. There is also some volume loss in the left upper lobe. Within the band like scarring there was a new 3.7 x 3.1 x 1.3 cm area of cavitation. He was also noted to be a 1.1 cm lobulated nodular focus within the wall of the cavity. Gen. impression was the patient may potentially have recurrent lung cancer.    Impression:  Presumptive stage I non-small cell lung cancer presenting in left upper lobe. The patient proceeded with SBRT approximately 1 year ago. He was not a candidate for surgery or biopsy Recent CT scan  imaging is concerning for possible recurrence. Images are difficult to interpret in light of his prior severe emphysematous changes. I discussed with the patient that if he is felt to have recurrence he would be a candidate for conventional radiation therapy to this area. He would not be a candidate for surgerygiven his lung situation. He would likely not be a candidate for an aggressive course of chemotherapy either . Given the CT findings I would recommend the patient proceed with PET scan for further evaluation. At this area shows activity on PET scan then I would be willing to proceed with conventional radiation therapy to this region.  Plan:  Patient will proceed with chest CT scan  in February but this may be  changed to a PET scan if medical oncology is agreeable at the [facility].  ____________________________________ Gery Pray, MD

## 2017-10-06 ENCOUNTER — Other Ambulatory Visit: Payer: Self-pay | Admitting: Oncology

## 2017-10-06 ENCOUNTER — Telehealth: Payer: Self-pay | Admitting: Oncology

## 2017-10-06 DIAGNOSIS — R911 Solitary pulmonary nodule: Secondary | ICD-10-CM

## 2017-10-06 NOTE — Telephone Encounter (Signed)
Left a message on the [facility] nurse triage line regarding ordering a PET scan for patient.  Tried to talk with his oncologist, Dr. Posey Pronto, directly but was told he was out today and was transferred to the triage line.

## 2017-10-06 NOTE — Telephone Encounter (Signed)
Dillon Olson from the [place] in [place] called back and said we need to fax a request for additional services to [phone].  He also gave a contact number of [phone] to contact for more information.  Called the number and a form will be faxed to [place] that needs to be faxed back along with office notes about the need for services.

## 2017-10-26 ENCOUNTER — Other Ambulatory Visit: Payer: Self-pay | Admitting: Oncology

## 2017-10-26 ENCOUNTER — Telehealth: Payer: Self-pay | Admitting: *Deleted

## 2017-10-26 DIAGNOSIS — R911 Solitary pulmonary nodule: Secondary | ICD-10-CM

## 2017-10-26 NOTE — Telephone Encounter (Signed)
CALLED PATIENT TO INFORM OF PET SCAN FOR 10-31-17- ARRIVAL TIME - 3:30 PM @ [facility], PT. TO BE NPO- 6 HRS. PRIOR TO TEST, SPOKE WITH PATIENT AND HE IS AWARE OF THIS TEST

## 2017-10-30 ENCOUNTER — Other Ambulatory Visit: Payer: Self-pay | Admitting: Radiation Oncology

## 2017-10-30 DIAGNOSIS — R911 Solitary pulmonary nodule: Secondary | ICD-10-CM

## 2017-10-31 ENCOUNTER — Encounter (HOSPITAL_COMMUNITY): Payer: Self-pay

## 2017-10-31 ENCOUNTER — Encounter (HOSPITAL_COMMUNITY)
Admission: RE | Admit: 2017-10-31 | Discharge: 2017-10-31 | Disposition: A | Discharge status: Home / Self Care | Payer: No Typology Code available for payment source | Source: Ambulatory Visit | Attending: Radiation Oncology | Admitting: Radiation Oncology

## 2017-10-31 DIAGNOSIS — R911 Solitary pulmonary nodule: Secondary | ICD-10-CM

## 2017-10-31 HISTORY — DX: Malignant (primary) neoplasm, unspecified: C80.1

## 2017-10-31 LAB — GLUCOSE, CAPILLARY: GLUCOSE-CAPILLARY: 85 mg/dL (ref 65–99)

## 2017-10-31 MED ORDER — FLUDEOXYGLUCOSE F - 18 (FDG) INJECTION
5.8000 | Freq: Once | INTRAVENOUS | Status: AC
  Administered 2017-10-31: 5.8 via INTRAVENOUS

## 2017-11-02 ENCOUNTER — Telehealth: Payer: Self-pay | Admitting: *Deleted

## 2017-11-02 ENCOUNTER — Ambulatory Visit: Admission: RE | Admit: 2017-11-02 | Payer: Non-veteran care | Source: Ambulatory Visit | Admitting: Radiation Oncology

## 2017-11-02 NOTE — Telephone Encounter (Signed)
Called patient to inform of fu with Dr. Sondra Come on 11-07-17 @ 10:30 am , spoke with patient and he is aware of this appt.

## 2017-11-07 ENCOUNTER — Encounter: Payer: Self-pay | Admitting: Radiation Oncology

## 2017-11-07 ENCOUNTER — Other Ambulatory Visit: Payer: Self-pay

## 2017-11-07 ENCOUNTER — Ambulatory Visit
Admission: RE | Admit: 2017-11-07 | Discharge: 2017-11-07 | Disposition: A | Discharge status: Home / Self Care | Payer: Non-veteran care | Source: Ambulatory Visit | Attending: Radiation Oncology | Admitting: Radiation Oncology

## 2017-11-07 VITALS — BP 129/80 | HR 73 | Temp 98.3°F | Resp 18 | Wt 112.4 lb

## 2017-11-07 DIAGNOSIS — R911 Solitary pulmonary nodule: Secondary | ICD-10-CM

## 2017-11-07 NOTE — Progress Notes (Signed)
Radiation Oncology         [phone] ________________________________  Name: Dillon Olson MRN: [id number]  Date: 11/07/2017  DOB: 1956-01-12  Follow-Up Visit Note  CC: System, Pcp Not In  Leeann Must, MD    ICD-10-CM   1. Solitary pulmonary nodule R91.1     Diagnosis:   Solitary pulmonary nodule presenting in the left upper    Interval Since Last Radiation:  14 months  Narrative:  The patient returns today for routine follow-up.  The patient underwent a chest CT scan recently on December 8 in [facility] which questioned a possible recurrence in the treatment area of the left upper lobe. In light of this a PET scan was performed which showed low level activity in the area of presentation. The SUV was actually less than what was noted prior to the patient's radiation treatment. No new areas of involvement were noted on the PET scan.  Patient denies any pain in the chest area cough or hemoptysis. He denies any new bony pain headaches dizziness or blurred vision. His breathing is stable. He does not use supplemental oxygen                             ALLERGIES:  is allergic to codeine.  Meds: Current Outpatient Medications  Medication Sig Dispense Refill  . cyclobenzaprine (FLEXERIL) 10 MG tablet Take 10 mg by mouth at bedtime.    . ENSURE PLUS (ENSURE PLUS) LIQD Take 1 Can by mouth.    . oxyCODONE-acetaminophen (PERCOCET) 10-325 MG tablet Take 1 tablet by mouth every 8 (eight) hours as needed for pain. Takes 3 times a day.    . albuterol (PROVENTIL HFA;VENTOLIN HFA) 108 (90 Base) MCG/ACT inhaler Inhale into the lungs every 6 (six) hours as needed for wheezing or shortness of breath.     No current facility-administered medications for this encounter.     Physical Findings: The patient is in no acute distress. Patient is alert and oriented.  weight is 112 lb 6 oz (51 kg). His oral temperature is 98.3 F (36.8 C). His blood pressure is 129/80 and his pulse is 73. His  respiration is 18 and oxygen saturation is 99%. .  Lungs are clear to auscultation bilaterally. Heart has regular rate and rhythm. No palpable cervical, supraclavicular, or axillary adenopathy. Abdomen soft, non-tender, normal bowel sounds.  Lab Findings: No results found for: WBC, HGB, HCT, MCV, PLT  Radiographic Findings: Nm Pet Image Initial (pi) Skull Base To Thigh  Result Date: 11/01/2017 CLINICAL DATA:  Initial treatment strategy for solitary pulmonary nodule. History of radiation therapy/SBRT. EXAM: NUCLEAR MEDICINE PET SKULL BASE TO THIGH TECHNIQUE: 5.8 mCi F-18 FDG was injected intravenously. Full-ring PET imaging was performed from the skull base to thigh after the radiotracer. CT data was obtained and used for attenuation correction and anatomic localization. FASTING BLOOD GLUCOSE:  Value: 85 mg/dl COMPARISON:  Outside chest CT 08/23/2018; report from prior PET[facility] from [facility] dated 06/15/2016. FINDINGS: NECK No hypermetabolic lymph nodes in the neck. Focus of accentuated activity along the patient's dentures or anterior tongue, maximum SUV 13.5, most likely to be incidental. Symmetric glottic activity is likely physiologic. CHEST There is a band of atelectasis extending up to a cavitary lesion measuring about 3.4 by 2.7 cm (formerly 4.0 by 3.1 cm) containing a smaller (2.3 by 2.2 cm) cavity. Inside this cavity there is a 1.5 by 0.6 cm focus  of nodularity which previously measured 1.4 by 0.7 cm. The margins of this cavity have a maximum SUV of 3.3. The cavity is smaller vertically, about 2.0 cm in vertical diameter. No worrisome hypermetabolic uptake in the small thoracic lymph nodes. Severe centrilobular emphysema with clustered bulla at the lung apices. Dependent subsegmental atelectasis or scarring in the right lower lobe with mild scarring in the right middle lobe laterally. No new lesion is identified. Coronary, aortic arch, and branch vessel atherosclerotic  vascular disease. ABDOMEN/PELVIS No abnormal hypermetabolic activity within the liver, pancreas, adrenal glands, or spleen. No hypermetabolic lymph nodes in the abdomen or pelvis. Aortoiliac atherosclerotic vascular disease. Mild sigmoid colon diverticulosis. Dense calcifications centrally in the prostate gland. SKELETON No focal hypermetabolic activity to suggest skeletal metastasis. IMPRESSION: 1. The cavitary lesion in the left upper lobe is surrounded by a band of volume loss/atelectasis. It has a variable wall thickness and total size about 3.4 by 2.7 by 2.0 cm, somewhat reduced in volume compared to the prior chest CT from 08/23/2017. There continues to be an internal nodular focus. Maximum SUV currently 3.3, previously reported on outside exam at 4.8 back on 06/15/2016. Possibilities include fungal infection of a bulla with potential aspergilloma, or site of treated lung cancer with residual nodularity but only low-grade activity. No findings of metastatic disease or metabolically active adenopathy. Surveillance is likely warranted. 2. Focally high activity along the patient's dentures, likely artifactual/physiologic. 3. Other imaging findings of potential clinical significance: Aortic Atherosclerosis (ICD10-I70.0) and Emphysema (ICD10-J43.9). Sigmoid colon diverticulosis. Electronically Signed   By: Van Clines M.D.   On: 11/01/2017 08:43    Impression:   Solitary pulmonary nodule presenting in the left upper. Recent PET scan is not concerning for recurrence. I reviewed the patient's PET scan with the patient and significant other. I recommend the patient continue with routine chest CT scanning through the [facility] hospital. Print out of the report was given to the patient  Plan:  Routine follow-up in 6 months in radiation oncology. The patient will continue to forward his CT scan images and reports from the [place] system.  ____________________________________ Gery Pray, MD

## 2017-11-07 NOTE — Progress Notes (Signed)
Dillon Olson is here for a follow-up appointment. Denies any pain .States that he has fatigue sometime.Denies any shortness of breath or difficulty with swallowing. States that his appetite is alright. Denies coughing up any phlegm or blood.Denies any blood in his salvia.  Vitals:   11/07/17 1028  BP: 129/80  Pulse: 73  Resp: 18  Temp: 98.3 F (36.8 C)  TempSrc: Oral  SpO2: 99%  Weight: 112 lb 6 oz (51 kg)   Wt Readings from Last 3 Encounters:  11/07/17 112 lb 6 oz (51 kg)  09/14/17 109 lb 6.4 oz (49.6 kg)  04/27/17 94 lb 3.2 oz (42.7 kg)

## 2017-11-08 ENCOUNTER — Encounter: Payer: Self-pay | Admitting: *Deleted

## 2017-11-08 NOTE — Progress Notes (Signed)
On 11-08-17 mail medical records to the patient

## 2018-05-07 ENCOUNTER — Other Ambulatory Visit: Payer: Self-pay

## 2018-05-07 ENCOUNTER — Encounter: Payer: Self-pay | Admitting: Radiation Oncology

## 2018-05-07 ENCOUNTER — Ambulatory Visit
Admission: RE | Admit: 2018-05-07 | Discharge: 2018-05-07 | Disposition: A | Discharge status: Home / Self Care | Payer: No Typology Code available for payment source | Source: Ambulatory Visit | Attending: Radiation Oncology | Admitting: Radiation Oncology

## 2018-05-07 VITALS — BP 127/75 | HR 73 | Temp 98.1°F | Wt 106.4 lb

## 2018-05-07 DIAGNOSIS — M545 Low back pain: Secondary | ICD-10-CM | POA: Diagnosis not present

## 2018-05-07 DIAGNOSIS — Z79899 Other long term (current) drug therapy: Secondary | ICD-10-CM | POA: Insufficient documentation

## 2018-05-07 DIAGNOSIS — Z885 Allergy status to narcotic agent status: Secondary | ICD-10-CM | POA: Insufficient documentation

## 2018-05-07 DIAGNOSIS — R911 Solitary pulmonary nodule: Secondary | ICD-10-CM | POA: Diagnosis not present

## 2018-05-07 DIAGNOSIS — G8929 Other chronic pain: Secondary | ICD-10-CM | POA: Insufficient documentation

## 2018-05-07 NOTE — Progress Notes (Signed)
  Radiation Oncology         [phone] ________________________________  Name: Dillon Olson MRN: [id number]  Date: 05/07/2018  DOB: December 20, 1955  Follow-Up Visit Note  CC: System, Pcp Not In  Leeann Must, MD    ICD-10-CM   1. Solitary pulmonary nodule R91.1     Diagnosis:   Solitary pulmonary nodule presenting in the left upper lung   Radiation treatment dates:   08/29/16, 08/31/16, 09/02/16  Site/dose:   Left upper lung / 54 Gy in 3 fractions  Interval Since Last Radiation:  1 year 8  months  Narrative:  The patient returns today for routine follow-up. He is accompanied by his wife today. He has been doing well overall. He recently had a CT scan at the [place] on 05/01/2018 and he hasn't received a paper copy of those results as of yet. He recently got a [place] Dane puppy, which he is excited for.    On review of systems, he reports intermittent chronic lower back pain, intermittent chronic neck pain, and intermittent headaches (treated with his prescribed pain medication). he denies SOB, CP, hemoptysis, blurred vision, dizziness, and any other symptoms. He is not on oxygen at this time. Pertinent positives are listed and detailed within the above HPI.                        ALLERGIES:  is allergic to codeine.  Meds: Current Outpatient Medications  Medication Sig Dispense Refill  . albuterol (PROVENTIL HFA;VENTOLIN HFA) 108 (90 Base) MCG/ACT inhaler Inhale into the lungs every 6 (six) hours as needed for wheezing or shortness of breath.    . cyclobenzaprine (FLEXERIL) 10 MG tablet Take 10 mg by mouth at bedtime.    . ENSURE PLUS (ENSURE PLUS) LIQD Take 1 Can by mouth.    . oxyCODONE-acetaminophen (PERCOCET) 10-325 MG tablet Take 1 tablet by mouth every 8 (eight) hours as needed for pain. Takes 3 times a day.     No current facility-administered medications for this encounter.     Physical Findings: The patient is in no acute distress. Patient is alert and oriented.  weight is  106 lb 6.4 oz (48.3 kg). His oral temperature is 98.1 F (36.7 C). His blood pressure is 127/75 and his pulse is 73. His oxygen saturation is 99%. .  Lungs are clear to auscultation bilaterally. Heart has regular rate and rhythm. No palpable cervical, supraclavicular, or axillary adenopathy. Abdomen soft, non-tender, normal bowel sounds.   Lab Findings: No results found for: WBC, HGB, HCT, MCV, PLT  Radiographic Findings: No results found.  Impression:  Clinically doing very well. Waiting on results of recent CT scan at the [place]. No new medical issues.   Plan:  Patient will bring his CT report to his office as soon as available, otherwise will follow up in 6 months in radiation oncology.   ____________________________________ -----------------------------------  Blair Promise, PhD, MD  This document serves as a record of services personally performed by Gery Pray, MD. It was created on his behalf by [facility], a trained medical scribe. The creation of this record is based on the scribe's personal observations and the provider's statements to them. This document has been checked and approved by the attending provider.

## 2018-05-07 NOTE — Progress Notes (Signed)
Pt here today for a follow-up for left lung cancer. Pt denies having any pain. Pt states mild fatigue. Pt denies having a cough. Pt denies having hemoptysis. Pt denies having any shortness of breath. Pt denies having any painful or difficulty swallowing. Pt denies having any skin irritation from radiation treatments. Pt states that he is not on chemotherapy treatments. Pt states that appetite is still the same. Pt has lost weight since February. Weight in February was 112 lbs. Today's weight  Is 106.6 lbs.  BP 127/75   Pulse 73   Temp 98.1 F (36.7 C) (Oral)   Wt 106 lb 6.4 oz (48.3 kg)   SpO2 99%   BMI 17.71 kg/m    Wt Readings from Last 3 Encounters:  05/07/18 106 lb 6.4 oz (48.3 kg)  11/07/17 112 lb 6 oz (51 kg)  09/14/17 109 lb 6.4 oz (49.6 kg)

## 2018-10-30 ENCOUNTER — Other Ambulatory Visit: Payer: Self-pay | Admitting: Radiation Oncology

## 2018-10-30 ENCOUNTER — Ambulatory Visit
Admission: RE | Admit: 2018-10-30 | Discharge: 2018-10-30 | Disposition: A | Discharge status: Home / Self Care | Payer: Self-pay | Source: Ambulatory Visit | Attending: Radiation Oncology | Admitting: Radiation Oncology

## 2018-10-30 DIAGNOSIS — C3490 Malignant neoplasm of unspecified part of unspecified bronchus or lung: Secondary | ICD-10-CM

## 2018-11-05 ENCOUNTER — Ambulatory Visit
Admission: RE | Admit: 2018-11-05 | Discharge: 2018-11-05 | Disposition: A | Discharge status: Home / Self Care | Payer: Non-veteran care | Source: Ambulatory Visit | Attending: Radiation Oncology | Admitting: Radiation Oncology

## 2018-11-05 ENCOUNTER — Other Ambulatory Visit: Payer: Self-pay

## 2018-11-05 ENCOUNTER — Encounter: Payer: Self-pay | Admitting: Radiation Oncology

## 2018-11-05 VITALS — BP 134/79 | HR 72 | Temp 98.0°F | Resp 20 | Ht 65.0 in | Wt 113.4 lb

## 2018-11-05 DIAGNOSIS — Z923 Personal history of irradiation: Secondary | ICD-10-CM | POA: Insufficient documentation

## 2018-11-05 DIAGNOSIS — R911 Solitary pulmonary nodule: Secondary | ICD-10-CM | POA: Insufficient documentation

## 2018-11-05 NOTE — Progress Notes (Signed)
  Radiation Oncology         [phone]) [phone] ________________________________  Name: Dillon Olson MRN: [id number]  Date: 11/05/2018  DOB: May 27, 1956  Follow-Up Visit Note  CC: System, Pcp Not In  Leeann Must, MD    ICD-10-CM   1. Solitary pulmonary nodule R91.1     Diagnosis:   Solitary pulmonary nodule presenting in the left upper lung   Radiation treatment dates:   08/29/16, 08/31/16, 09/02/16  Site/dose:   Left upper lung / 54 Gy in 3 fractions  Interval Since Last Radiation:  2 years, 2  months  Narrative:  The patient returns today for routine follow-up.  He has been doing well overall. He had a CT scan on September 22, 2018 at an outside location. [facility])   On review of systems, he reports a recent cold accompanied by congestion and coughing. He reports mild SOB on exertion. he denies chest pain, hemoptysis and any other symptoms. He is not on oxygen at this time. Pertinent positives are listed and detailed within the above HPI.                        ALLERGIES:  is allergic to codeine.  Meds: Current Outpatient Medications  Medication Sig Dispense Refill  . cyclobenzaprine (FLEXERIL) 10 MG tablet Take 10 mg by mouth at bedtime.    Marland Kitchen oxyCODONE-acetaminophen (PERCOCET) 10-325 MG tablet Take 1 tablet by mouth every 8 (eight) hours as needed for pain. Takes 3 times a day.    . albuterol (PROVENTIL HFA;VENTOLIN HFA) 108 (90 Base) MCG/ACT inhaler Inhale into the lungs every 6 (six) hours as needed for wheezing or shortness of breath.    . ENSURE PLUS (ENSURE PLUS) LIQD Take 1 Can by mouth.     No current facility-administered medications for this encounter.     Physical Findings: The patient is in no acute distress. Patient is alert and oriented.  height is 5\' 5"  (1.651 m) and weight is 113 lb 6.4 oz (51.4 kg). His oral temperature is 98 F (36.7 C). His blood pressure is 134/79 and his pulse is 72. His respiration is 20 and oxygen saturation is 100%. .  Lungs are clear to  auscultation bilaterally. Heart has regular rate and rhythm. No palpable cervical, supraclavicular, or axillary adenopathy. Abdomen soft, non-tender, normal bowel sounds.   Lab Findings: No results found for: WBC, HGB, HCT, MCV, PLT  Radiographic Findings: No results found.  Impression: Solitary pulmonary nodule presenting in the left upper lung. Clinically stable.  He had a recent chest CT which was scanned into [facility] radiology for review. Per oncologist report from the [facility] hospital there has been no progression. Per discussion with patient, there are some abnormalities in the pancreatic region and the pt will proceed with CT of the abdomen in the future for follow-up of this issue. Pt reports he will have another chest CT scan likely in July.  Plan:  Routine follow-up in our department in August after repeat chest CT scan   ____________________________________ -----------------------------------  Blair Promise, PhD, MD  This document serves as a record of services personally performed by Gery Pray, MD. It was created on his behalf by Mary-Margaret Loma Messing, a trained medical scribe. The creation of this record is based on the scribe's personal observations and the provider's statements to them. This document has been checked and approved by the attending provider.

## 2018-11-05 NOTE — Progress Notes (Signed)
Dillon Olson is here for a follow-up appointment today.Patient denies any pain ,reports mild fatigue.Patient denies any sob,or difficulty with swallowing. Patient states that his appetite is ok.Patient denies any coughing.  Vitals:   11/05/18 1127  BP: 134/79  Pulse: 72  Resp: 20  Temp: 98 F (36.7 C)  TempSrc: Oral  SpO2: 100%  Weight: 113 lb 6.4 oz (51.4 kg)  Height: 5\' 5"  (1.651 m)   Wt Readings from Last 3 Encounters:  11/05/18 113 lb 6.4 oz (51.4 kg)  05/07/18 106 lb 6.4 oz (48.3 kg)  11/07/17 112 lb 6 oz (51 kg)

## 2019-03-13 DIAGNOSIS — J939 Pneumothorax, unspecified: Secondary | ICD-10-CM

## 2019-03-13 HISTORY — DX: Pneumothorax, unspecified: J93.9

## 2019-04-06 ENCOUNTER — Inpatient Hospital Stay (HOSPITAL_COMMUNITY): Payer: No Typology Code available for payment source

## 2019-04-06 ENCOUNTER — Inpatient Hospital Stay (HOSPITAL_COMMUNITY)
Admission: AD | Admit: 2019-04-06 | Discharge: 2019-04-22 | DRG: 163 | Disposition: A | Discharge status: Home / Self Care | Payer: No Typology Code available for payment source | Source: Other Acute Inpatient Hospital | Attending: Cardiothoracic Surgery | Admitting: Cardiothoracic Surgery

## 2019-04-06 DIAGNOSIS — I700 Atherosclerosis of aorta: Secondary | ICD-10-CM | POA: Diagnosis present

## 2019-04-06 DIAGNOSIS — E43 Unspecified severe protein-calorie malnutrition: Secondary | ICD-10-CM | POA: Diagnosis present

## 2019-04-06 DIAGNOSIS — R627 Adult failure to thrive: Secondary | ICD-10-CM | POA: Diagnosis present

## 2019-04-06 DIAGNOSIS — Z9689 Presence of other specified functional implants: Secondary | ICD-10-CM

## 2019-04-06 DIAGNOSIS — Z09 Encounter for follow-up examination after completed treatment for conditions other than malignant neoplasm: Secondary | ICD-10-CM

## 2019-04-06 DIAGNOSIS — Z20828 Contact with and (suspected) exposure to other viral communicable diseases: Secondary | ICD-10-CM | POA: Diagnosis present

## 2019-04-06 DIAGNOSIS — J9311 Primary spontaneous pneumothorax: Secondary | ICD-10-CM | POA: Diagnosis not present

## 2019-04-06 DIAGNOSIS — Z4682 Encounter for fitting and adjustment of non-vascular catheter: Secondary | ICD-10-CM

## 2019-04-06 DIAGNOSIS — J9382 Other air leak: Secondary | ICD-10-CM

## 2019-04-06 DIAGNOSIS — R000 Tachycardia, unspecified: Secondary | ICD-10-CM | POA: Diagnosis not present

## 2019-04-06 DIAGNOSIS — R0602 Shortness of breath: Secondary | ICD-10-CM

## 2019-04-06 DIAGNOSIS — Z681 Body mass index (BMI) 19 or less, adult: Secondary | ICD-10-CM | POA: Diagnosis not present

## 2019-04-06 DIAGNOSIS — J939 Pneumothorax, unspecified: Secondary | ICD-10-CM | POA: Diagnosis present

## 2019-04-06 DIAGNOSIS — R911 Solitary pulmonary nodule: Secondary | ICD-10-CM | POA: Diagnosis present

## 2019-04-06 DIAGNOSIS — J948 Other specified pleural conditions: Secondary | ICD-10-CM | POA: Diagnosis present

## 2019-04-06 DIAGNOSIS — R64 Cachexia: Secondary | ICD-10-CM | POA: Diagnosis present

## 2019-04-06 DIAGNOSIS — Z87891 Personal history of nicotine dependence: Secondary | ICD-10-CM

## 2019-04-06 DIAGNOSIS — Z923 Personal history of irradiation: Secondary | ICD-10-CM

## 2019-04-06 DIAGNOSIS — C3412 Malignant neoplasm of upper lobe, left bronchus or lung: Secondary | ICD-10-CM | POA: Diagnosis present

## 2019-04-06 DIAGNOSIS — J439 Emphysema, unspecified: Secondary | ICD-10-CM | POA: Diagnosis present

## 2019-04-06 DIAGNOSIS — J9383 Other pneumothorax: Secondary | ICD-10-CM

## 2019-04-06 HISTORY — DX: Chronic obstructive pulmonary disease, unspecified: J44.9

## 2019-04-06 LAB — CBC WITH DIFFERENTIAL/PLATELET
Abs Immature Granulocytes: 0.04 10*3/uL (ref 0.00–0.07)
Basophils Absolute: 0.1 10*3/uL (ref 0.0–0.1)
Basophils Relative: 1 %
Eosinophils Absolute: 0.4 10*3/uL (ref 0.0–0.5)
Eosinophils Relative: 4 %
HCT: 43.5 % (ref 39.0–52.0)
Hemoglobin: 15 g/dL (ref 13.0–17.0)
Immature Granulocytes: 0 %
Lymphocytes Relative: 23 %
Lymphs Abs: 2.7 10*3/uL (ref 0.7–4.0)
MCH: 32.3 pg (ref 26.0–34.0)
MCHC: 34.5 g/dL (ref 30.0–36.0)
MCV: 93.8 fL (ref 80.0–100.0)
Monocytes Absolute: 0.9 10*3/uL (ref 0.1–1.0)
Monocytes Relative: 7 %
Neutro Abs: 7.6 10*3/uL (ref 1.7–7.7)
Neutrophils Relative %: 65 %
Platelets: 302 10*3/uL (ref 150–400)
RBC: 4.64 MIL/uL (ref 4.22–5.81)
RDW: 13.1 % (ref 11.5–15.5)
WBC: 11.7 10*3/uL — ABNORMAL HIGH (ref 4.0–10.5)
nRBC: 0 % (ref 0.0–0.2)

## 2019-04-06 LAB — COMPREHENSIVE METABOLIC PANEL
ALT: 15 U/L (ref 0–44)
AST: 18 U/L (ref 15–41)
Albumin: 3.6 g/dL (ref 3.5–5.0)
Alkaline Phosphatase: 95 U/L (ref 38–126)
Anion gap: 12 (ref 5–15)
BUN: 13 mg/dL (ref 8–23)
CO2: 24 mmol/L (ref 22–32)
Calcium: 9.4 mg/dL (ref 8.9–10.3)
Chloride: 99 mmol/L (ref 98–111)
Creatinine, Ser: 0.87 mg/dL (ref 0.61–1.24)
GFR calc Af Amer: >60 mL/min (ref >60)
GFR calc non Af Amer: >60 mL/min (ref >60)
Glucose, Bld: 106 mg/dL — ABNORMAL HIGH (ref 70–99)
Potassium: 3.9 mmol/L (ref 3.5–5.1)
Sodium: 135 mmol/L (ref 135–145)
Total Bilirubin: 0.4 mg/dL (ref 0.3–1.2)
Total Protein: 6.9 g/dL (ref 6.5–8.1)

## 2019-04-06 LAB — URINALYSIS, COMPLETE (UACMP) WITH MICROSCOPIC
Bacteria, UA: NONE SEEN
Bilirubin Urine: NEGATIVE
Glucose, UA: NEGATIVE mg/dL
Hgb urine dipstick: NEGATIVE
Ketones, ur: NEGATIVE mg/dL
Leukocytes,Ua: NEGATIVE
Nitrite: NEGATIVE
Protein, ur: NEGATIVE mg/dL
Specific Gravity, Urine: 1.015 (ref 1.005–1.030)
pH: 6 (ref 5.0–8.0)

## 2019-04-06 LAB — PHOSPHORUS: Phosphorus: 3.7 mg/dL (ref 2.5–4.6)

## 2019-04-06 LAB — TSH: TSH: 0.987 u[IU]/mL (ref 0.350–4.500)

## 2019-04-06 LAB — MAGNESIUM: Magnesium: 1.9 mg/dL (ref 1.7–2.4)

## 2019-04-06 MED ORDER — HYDROCODONE-ACETAMINOPHEN 10-325 MG PO TABS
1.00 | ORAL_TABLET | ORAL | Status: DC
Start: ? — End: 2019-04-06

## 2019-04-06 MED ORDER — MEGESTROL ACETATE 20 MG PO TABS
20.00 | ORAL_TABLET | ORAL | Status: DC
Start: 2019-04-06 — End: 2019-04-06

## 2019-04-06 MED ORDER — ENSURE PLUS PO LIQD: 237.0000 mL | Freq: Every day | ORAL | Status: DC

## 2019-04-06 MED ORDER — GENERIC EXTERNAL MEDICATION
Status: DC
Start: ? — End: 2019-04-06

## 2019-04-06 MED ORDER — ACETAMINOPHEN 650 MG RE SUPP
650.00 | RECTAL | Status: DC
Start: ? — End: 2019-04-06

## 2019-04-06 MED ORDER — TIZANIDINE HCL 4 MG PO TABS
2.00 | ORAL_TABLET | ORAL | Status: DC
Start: ? — End: 2019-04-06

## 2019-04-06 MED ORDER — HYDROCODONE-ACETAMINOPHEN 10-325 MG PO TABS
1.0000 | ORAL_TABLET | ORAL | Status: DC | PRN
  Administered 2019-04-06 – 2019-04-22 (×90): 1 via ORAL
  Filled 2019-04-06 (×89): qty 1

## 2019-04-06 MED ORDER — SODIUM CHLORIDE 0.9 % IV SOLN
10.00 | INTRAVENOUS | Status: DC
Start: ? — End: 2019-04-06

## 2019-04-06 MED ORDER — ACETAMINOPHEN 325 MG PO TABS
650.00 | ORAL_TABLET | ORAL | Status: DC
Start: ? — End: 2019-04-06

## 2019-04-06 MED ORDER — ENSURE ENLIVE PO LIQD
237.0000 mL | Freq: Every day | ORAL | Status: DC
  Administered 2019-04-08 – 2019-04-09 (×2): 237 mL via ORAL

## 2019-04-06 MED ORDER — ALUM & MAG HYDROXIDE-SIMETH 200-200-20 MG/5ML PO SUSP
20.00 | ORAL | Status: DC
Start: ? — End: 2019-04-06

## 2019-04-06 MED ORDER — ENOXAPARIN SODIUM 40 MG/0.4ML ~~LOC~~ SOLN
40.0000 mg | SUBCUTANEOUS | Status: DC
  Administered 2019-04-06 – 2019-04-13 (×8): 40 mg via SUBCUTANEOUS
  Filled 2019-04-06 (×9): qty 0.4

## 2019-04-06 MED ORDER — ALBUTEROL SULFATE (2.5 MG/3ML) 0.083% IN NEBU
2.50 | INHALATION_SOLUTION | RESPIRATORY_TRACT | Status: DC
Start: ? — End: 2019-04-06

## 2019-04-06 MED ORDER — LORAZEPAM 0.5 MG PO TABS
0.50 | ORAL_TABLET | ORAL | Status: DC
Start: ? — End: 2019-04-06

## 2019-04-06 MED ORDER — IPRATROPIUM-ALBUTEROL 0.5-2.5 (3) MG/3ML IN SOLN: 3.0000 mL | RESPIRATORY_TRACT | Status: DC | PRN

## 2019-04-06 MED ORDER — LIDOCAINE 5 % EX PTCH
1.0000 | MEDICATED_PATCH | CUTANEOUS | Status: DC
  Administered 2019-04-06 – 2019-04-21 (×15): 1 via TRANSDERMAL
  Filled 2019-04-06 (×16): qty 1

## 2019-04-06 MED ORDER — CYCLOBENZAPRINE HCL 5 MG PO TABS
2.5000 mg | ORAL_TABLET | Freq: Every day | ORAL | Status: DC | PRN
  Administered 2019-04-07 – 2019-04-22 (×16): 5 mg via ORAL
  Filled 2019-04-06 (×11): qty 1
  Filled 2019-04-06: qty 2
  Filled 2019-04-06 (×5): qty 1
  Filled 2019-04-06: qty 2
  Filled 2019-04-06 (×3): qty 1

## 2019-04-06 MED ORDER — NITROGLYCERIN 0.4 MG SL SUBL
.40 | SUBLINGUAL_TABLET | SUBLINGUAL | Status: DC
Start: ? — End: 2019-04-06

## 2019-04-06 MED ORDER — PANTOPRAZOLE SODIUM 40 MG PO TBEC
40.00 | DELAYED_RELEASE_TABLET | ORAL | Status: DC
Start: 2019-04-07 — End: 2019-04-06

## 2019-04-06 MED ORDER — POLYETHYLENE GLYCOL 3350 17 G PO PACK
17.00 | PACK | ORAL | Status: DC
Start: 2019-04-07 — End: 2019-04-06

## 2019-04-06 MED ORDER — DOCUSATE SODIUM 100 MG PO CAPS
100.00 | ORAL_CAPSULE | ORAL | Status: DC
Start: ? — End: 2019-04-06

## 2019-04-06 MED ORDER — HYDROMORPHONE HCL 1 MG/ML IJ SOLN
1.00 | INTRAMUSCULAR | Status: DC
Start: ? — End: 2019-04-06

## 2019-04-06 NOTE — Progress Notes (Signed)
Page doctor Marthenia Rolling , he returned my page and said he would come and see the pt.

## 2019-04-06 NOTE — H&P (Signed)
History and Physical  Dillon Olson BSJ:[id number] DOB: 1955/12/30 DOA: 04/06/2019  Referring physician: Transferred from [facility] PCP: [place] Not In  Outpatient Specialists: Oncology with at the [facility] at [place], [place] Patient coming from: [facility]  Chief Complaint: Left-sided pneumothorax  HPI: Patient is a 63 year old male with past medical history significant for reported left lung cancer status post radiation therapy and 34 pack years, but quit cigarette smoking about 5 years ago.  According to the patient, he was informed that he has left lung cancer about 3 years ago but biopsy could not be done due to the location of the cancer.  Patient has been treated with radiation therapy.  Patient's problem started on March 23, 2019 with left-sided pleuritic chest pain that was assumed to be secondary to pleurisy.  Apparently, patient has had pleurisy involving the right lung earlier.  Patient went for routine scheduled CT scan of the chest on April 01, 2019 and was found to have collapsed left lung.  Patient was admitted through [facility] and had chest tube placed.  Patient is not particularly happy with the care he received at [facility], [facility], specifically reporting delay with getting his PRN pain medication (hydrocodone).  Discussing with Dr. Fuller Plan, [facility] hospitalist who accepted the transfer, there may also be a problem with chest tube leak.  Dr. Tamala Julian discussed with the cardiothoracic team about a transfer, and the cardiothoracic team will see patient in consultation.  No fever or chills, no headache, no neck pain, no GI symptoms, no urinary symptoms and patient has occasional cough.  Patient has lost about 20 pounds in weight over the last 6 months.  No documentation from [facility] [place].   ED Course: Patient was transferred from [facility] [place], and has been an in-ptatient since  April 01, 2019.  Pertinent labs: None available  Imaging: None available  Review of Systems:  Negative for fever, visual changes, sore throat, rash, new muscle aches, chest pain, SOB, dysuria, bleeding, n/v/abdominal pain.  Past Medical History:  Diagnosis Date  . Cancer ([place])   . History of radiation therapy 08/29/16, 08/31/16, 09/02/16   Left upper lung / 54 Gy in 3 fractions  . Lung nodule     Past Surgical History:  Procedure Laterality Date  . APPENDECTOMY    . CHEST TUBE INSERTION  1981   collapsed left lung  . HERNIA REPAIR     x3  . lower back     L1 and L5  . neck fusion       reports that he quit smoking about 4 years ago. His smoking use included cigarettes. He started smoking about 42 years ago. He smoked 0.75 packs per day. He has never used smokeless tobacco. He reports that he does not drink alcohol or use drugs.  Allergies  Allergen Reactions  . Codeine Palpitations    Family History  Problem Relation Age of Onset  . Lung cancer Father      Prior to Admission medications   Medication Sig Start Date End Date Taking? Authorizing Provider  albuterol (PROVENTIL HFA;VENTOLIN HFA) 108 (90 Base) MCG/ACT inhaler Inhale into the lungs every 6 (six) hours as needed for wheezing or shortness of breath.    [provider]  cyclobenzaprine (FLEXERIL) 10 MG tablet Take 10 mg by mouth at bedtime.    [provider]  ENSURE PLUS (ENSURE PLUS) LIQD Take 1 Can by mouth.    [provider]  oxyCODONE-acetaminophen (PERCOCET) 10-325 MG tablet Take 1 tablet by mouth every 8 (eight) hours as needed for pain. Takes 3 times a day.    [provider]    Physical Exam: Vitals:   04/06/19 1846  BP: 138/83  Pulse: 92  Resp: 20  Temp: 98.2 F (36.8 C)  TempSrc: Oral  SpO2: 98%     Constitutional:  . Appears calm and comfortable.  Cachectic. Eyes:  . No pallor. No jaundice.  ENMT:  . external ears, nose appear normal Neck:  .  Neck is supple. No JVD Respiratory:  . Decreased air entry. Cardiovascular:  . S1S2 . No LE extremity edema   Abdomen:  . Abdomen is soft and non tender. Organs are difficult to assess. Neurologic:  . Awake and alert. . Moves all limbs.  Wt Readings from Last 3 Encounters:  11/05/18 51.4 kg  05/07/18 48.3 kg  11/07/17 51 kg    I have personally reviewed following labs and imaging studies  Labs on Admission:  CBC: No results for input(s): WBC, NEUTROABS, HGB, HCT, MCV, PLT in the last 168 hours. Basic Metabolic Panel: No results for input(s): NA, K, CL, CO2, GLUCOSE, BUN, CREATININE, CALCIUM, MG, PHOS in the last 168 hours. Liver Function Tests: No results for input(s): AST, ALT, ALKPHOS, BILITOT, PROT, ALBUMIN in the last 168 hours. No results for input(s): LIPASE, AMYLASE in the last 168 hours. No results for input(s): AMMONIA in the last 168 hours. Coagulation Profile: No results for input(s): INR, PROTIME in the last 168 hours. Cardiac Enzymes: No results for input(s): CKTOTAL, CKMB, CKMBINDEX, TROPONINI in the last 168 hours. BNP (last 3 results) No results for input(s): PROBNP in the last 8760 hours. HbA1C: No results for input(s): HGBA1C in the last 72 hours. CBG: No results for input(s): GLUCAP in the last 168 hours. Lipid Profile: No results for input(s): CHOL, HDL, LDLCALC, TRIG, CHOLHDL, LDLDIRECT in the last 72 hours. Thyroid Function Tests: No results for input(s): TSH, T4TOTAL, FREET4, T3FREE, THYROIDAB in the last 72 hours. Anemia Panel: No results for input(s): VITAMINB12, FOLATE, FERRITIN, TIBC, IRON, RETICCTPCT in the last 72 hours. Urine analysis: No results found for: COLORURINE, APPEARANCEUR, LABSPEC, PHURINE, GLUCOSEU, HGBUR, BILIRUBINUR, KETONESUR, PROTEINUR, UROBILINOGEN, NITRITE, LEUKOCYTESUR Sepsis Labs: @LABRCNTIP (procalcitonin:4,lacticidven:4) )No results found for this or any previous visit (from the past 240 hour(s)).    Radiological  Exams on Admission: No results found.  Active Problems:   * No active hospital problems. *   Assessment/Plan Pneumothorax left lung status post chest tube placement:  -Patient has presumed left lung cancer status post radiation therapy -CT chest done on April 01, 2019 at [place] is said to have revealed left-sided pneumothorax. -Chest tube was placed on April 01, 2019. -As per collateral information, there may be a leak around the chest tube. -Patient was transferred to the hospitalist service from [facility] as patient was unhappy with the care provider, particularly, delay in getting his pain medication. -No other constitutional symptoms reported. -Repeat chest CT without contrast.  Lung cancer: Obtain records from patient oncologist at the [facility] and [place] [facility] at [facility].   Weight loss: Likely related to lung cancer.  DVT prophylaxis: Subcutaneous Lovenox Code Status: Full code Family Communication:  Disposition Plan: Will depend on hospital course Consults called: Cardiothoracic surgery Admission status: Inpatient  Time spent: 55 minutes minutes  Dana Allan, MD  [facility] Pager #: [phone] 7PM-7AM contact night coverage as above  04/06/2019,  7:06 PM

## 2019-04-06 NOTE — Progress Notes (Signed)
Put pt on the monitor got a set of vitals and paged the doctor

## 2019-04-06 NOTE — Progress Notes (Signed)
Doctor page me back and told me he was not taking this pt. And I needed to page admitting to have him assigned. Admitting returned the page 10 min later and said thay would page the doctor to be assigned to that room

## 2019-04-07 DIAGNOSIS — J939 Pneumothorax, unspecified: Principal | ICD-10-CM

## 2019-04-07 DIAGNOSIS — R627 Adult failure to thrive: Secondary | ICD-10-CM | POA: Diagnosis present

## 2019-04-07 DIAGNOSIS — J439 Emphysema, unspecified: Secondary | ICD-10-CM | POA: Diagnosis present

## 2019-04-07 LAB — CBC
HCT: 43.1 % (ref 39.0–52.0)
Hemoglobin: 14.9 g/dL (ref 13.0–17.0)
MCH: 32.5 pg (ref 26.0–34.0)
MCHC: 34.6 g/dL (ref 30.0–36.0)
MCV: 94.1 fL (ref 80.0–100.0)
Platelets: 275 10*3/uL (ref 150–400)
RBC: 4.58 MIL/uL (ref 4.22–5.81)
RDW: 13.1 % (ref 11.5–15.5)
WBC: 8.5 10*3/uL (ref 4.0–10.5)
nRBC: 0 % (ref 0.0–0.2)

## 2019-04-07 LAB — BASIC METABOLIC PANEL
Anion gap: 11 (ref 5–15)
BUN: 13 mg/dL (ref 8–23)
CO2: 25 mmol/L (ref 22–32)
Calcium: 9.7 mg/dL (ref 8.9–10.3)
Chloride: 102 mmol/L (ref 98–111)
Creatinine, Ser: 0.97 mg/dL (ref 0.61–1.24)
GFR calc Af Amer: >60 mL/min (ref >60)
GFR calc non Af Amer: >60 mL/min (ref >60)
Glucose, Bld: 95 mg/dL (ref 70–99)
Potassium: 4.6 mmol/L (ref 3.5–5.1)
Sodium: 138 mmol/L (ref 135–145)

## 2019-04-07 LAB — HIV ANTIBODY (ROUTINE TESTING W REFLEX): HIV Screen 4th Generation wRfx: NONREACTIVE

## 2019-04-07 LAB — SARS CORONAVIRUS 2 BY RT PCR (HOSPITAL ORDER, PERFORMED IN ~~LOC~~ HOSPITAL LAB): SARS Coronavirus 2: NEGATIVE

## 2019-04-07 MED ORDER — MIRTAZAPINE 15 MG PO TABS
15.0000 mg | ORAL_TABLET | Freq: Every day | ORAL | Status: DC
  Administered 2019-04-07 – 2019-04-21 (×15): 15 mg via ORAL
  Filled 2019-04-07 (×15): qty 1

## 2019-04-07 MED ORDER — MEGESTROL ACETATE 400 MG/10ML PO SUSP
400.0000 mg | Freq: Every day | ORAL | Status: DC
  Administered 2019-04-07 – 2019-04-22 (×14): 400 mg via ORAL
  Filled 2019-04-07 (×18): qty 10

## 2019-04-07 NOTE — Progress Notes (Signed)
Went down with patient for CT scan.

## 2019-04-07 NOTE — Progress Notes (Signed)
Patient complaining about not having records from  [facility] here, discussed plan of care, medicated with pain medication on time, discussed medications from home to stimulate appitite, called primary to get them ordered. Talked to CV surgery, COVID test negative. Dr Orvan Seen came to talk with patient. Sign on door that patient is hard of hearing. Patient expressing appreciation. Will look to get records toomorrow

## 2019-04-07 NOTE — Progress Notes (Signed)
Pt's ct has been leaking per report. Chest tube is hooked up to -10 suction. Air leakage noticed. Ct dressing reinforced. Will continue to monitor.

## 2019-04-07 NOTE — Plan of Care (Signed)
Poc progressing.  

## 2019-04-07 NOTE — Progress Notes (Signed)
[facility]  Pt briefly seen; full report to follow pending review of past records and review of CT scan performed 04/06/19. In brief, he presented last Monday with left chest "pleurisy" and was noted to have PTX; chest tube inserted at [facility]. Has had persistent air leak; the patient requested transfer to [facility] where he had previously undergone SBRT for presumed Stage 1A NSCLC in '17 (no bx).  He may or may not need VATS bleb stapling and/or pleurodesis. Thank you for consulting [place]. Rayder Sullenger Z. Orvan Seen, [place]

## 2019-04-07 NOTE — Progress Notes (Signed)
Patient Demographics:    Dillon Olson, is a 63 y.o. male, DOB - 1956/04/13, HDQ:[id number]  Admit date - 04/06/2019   Admitting Physician Bonnell Public, MD  Outpatient Primary MD for the patient is System, Pcp Not In  LOS - 1   No chief complaint on file.       Subjective:    Dillon Olson today has no fevers, no emesis, no productive cough,  Assessment  & Plan :    Principal Problem:   Pneumothorax on left Active Problems:   Solitary pulmonary nodule/presumed left lung cancer-status post prior radiation therapy   COPD with emphysema/Bleps  Brief Summary:- 63 year old male reformed smoker with past medical history relevant for presumed left lung cancer not amenable to biopsy that was empirically treated with radiation therapy- previously undergone SBRT for presumed Stage 1A NSCLC in '17 (no biopsy).  Admitted to [facility] in [place] on 04/01/2019 with pneumothorax, status post chest tube placement at outside facility. Transferred from [facility] in [place] to [place]  on 04/06/2019 with persistent air leak from the left chest tube requiring CT surgery consult   A/p 1)Lt Sided pneumothorax--- status post chest tube placement at [facility] in [place] on 04/01/2019 with persistent air leak, CT surgery consult appreciated-- ,  As per Dr Orvan Seen (CT Surgery) pt may need VATS bleb stapling and/or pleurodesis.  2) presumed left lung cancer-- Obtain records from patient oncologist at the [facility] [place] [facility] at [place].   3)FTT/malignancy related cachexia--- Megace as ordered, Remeron nightly, Ensure nutritional supplements  4) COPD/emphysema with blebs--- continue bronchodilators  Disposition/Need for in-Hospital Stay- patient unable to be discharged at this time due to pneumothorax requiring left-sided chest tube placement  Code Status : Full  Family  Communication:   NA (patient is alert, awake and coherent)   Disposition Plan  : TBD  Consults  :  CT surgery  DVT Prophylaxis  :  Lovenox - - SCDs    Lab Results  Component Value Date   PLT 275 04/07/2019    Inpatient Medications  Scheduled Meds: . enoxaparin (LOVENOX) injection  40 mg Subcutaneous Q24H  . feeding supplement (ENSURE ENLIVE)  237 mL Oral Daily  . lidocaine  1 patch Transdermal Q24H  . megestrol  400 mg Oral Daily   Continuous Infusions: PRN Meds:.cyclobenzaprine, HYDROcodone-acetaminophen, ipratropium-albuterol    Anti-infectives (From admission, onward)   None        Objective:   Vitals:   04/06/19 2002 04/07/19 0422 04/07/19 0741 04/07/19 1454  BP: (!) 148/97 139/84 138/90 120/80  Pulse: 100 89 87 100  Resp: 16 19 18 18   Temp: 98.3 F (36.8 C) 98 F (36.7 C) 97.9 F (36.6 C) 97.6 F (36.4 C)  TempSrc: Oral Oral Oral Oral  SpO2: 100% 93% 100% 96%    Wt Readings from Last 3 Encounters:  11/05/18 51.4 kg  05/07/18 48.3 kg  11/07/17 51 kg     Intake/Output Summary (Last 24 hours) at 04/07/2019 1620 Last data filed at 04/07/2019 1300 Gross per 24 hour  Intake 440 ml  Output 8 ml  Net 432 ml     Physical Exam  Gen:- Awake Alert,  In no apparent distress  HEENT:- [place].AT, No sclera  icterus Neck-Supple Neck,No JVD,.  Nose- [place] 2 L/min Lungs-left-sided chest tube, diminished breath sounds on the left CV- S1, S2 normal, regular  Abd-  +ve B.Sounds, Abd Soft, No tenderness,    Extremity/Skin:- No  edema, pedal pulses present  Psych-affect is appropriate, oriented x3 Neuro-no new focal deficits, no tremors   Data Review:   Micro Results Recent Results (from the past 240 hour(s))  SARS Coronavirus 2 (CEPHEID - Performed in [place] hospital lab), Hosp Order     Status: None   Collection Time: 04/07/19 10:30 AM   Specimen: Nasopharyngeal Swab  Result Value Ref Range Status   SARS Coronavirus 2 NEGATIVE NEGATIVE Final    Comment:  (NOTE) If result is NEGATIVE SARS-CoV-2 target nucleic acids are NOT DETECTED. The SARS-CoV-2 RNA is generally detectable in upper and lower  respiratory specimens during the acute phase of infection. The lowest  concentration of SARS-CoV-2 viral copies this assay can detect is 250  copies / mL. A negative result does not preclude SARS-CoV-2 infection  and should not be used as the sole basis for treatment or other  patient management decisions.  A negative result may occur with  improper specimen collection / handling, submission of specimen other  than nasopharyngeal swab, presence of viral mutation(s) within the  areas targeted by this assay, and inadequate number of viral copies  (<250 copies / mL). A negative result must be combined with clinical  observations, patient history, and epidemiological information. If result is POSITIVE SARS-CoV-2 target nucleic acids are DETECTED. The SARS-CoV-2 RNA is generally detectable in upper and lower  respiratory specimens dur ing the acute phase of infection.  Positive  results are indicative of active infection with SARS-CoV-2.  Clinical  correlation with patient history and other diagnostic information is  necessary to determine patient infection status.  Positive results do  not rule out bacterial infection or co-infection with other viruses. If result is PRESUMPTIVE POSTIVE SARS-CoV-2 nucleic acids MAY BE PRESENT.   A presumptive positive result was obtained on the submitted specimen  and confirmed on repeat testing.  While 2019 novel coronavirus  (SARS-CoV-2) nucleic acids may be present in the submitted sample  additional confirmatory testing may be necessary for epidemiological  and / or clinical management purposes  to differentiate between  SARS-CoV-2 and other Sarbecovirus currently known to infect humans.  If clinically indicated additional testing with an alternate test  methodology [phone]) is advised. The SARS-CoV-2 RNA is  generally  detectable in upper and lower respiratory sp ecimens during the acute  phase of infection. The expected result is Negative. Fact Sheet for Patients:  StrictlyIdeas.no Fact Sheet for Healthcare Providers: BankingDealers.co.za This test is not yet approved or cleared by the [place] FDA and has been authorized for detection and/or diagnosis of SARS-CoV-2 by FDA under an Emergency Use Authorization (EUA).  This EUA will remain in effect (meaning this test can be used) for the duration of the COVID-19 declaration under Section 564(b)(1) of the Act, 21 U.S.C. section 360bbb-3(b)(1), unless the authorization is terminated or revoked sooner. Performed at [facility], [place] [address], [place], [place] [address]     Radiology Reports Ct Chest Wo Contrast  Result Date: 04/06/2019 CLINICAL DATA:  Pneumothorax follow-up EXAM: CT CHEST WITHOUT CONTRAST TECHNIQUE: Multidetector CT imaging of the chest was performed following the standard protocol without IV contrast. COMPARISON:  None. FINDINGS: Cardiovascular: The heart size is normal. Coronary artery calcifications are noted. Atherosclerotic changes are noted of  the thoracic aorta. There is no large pericardial effusion. Mediastinum/Nodes: --No mediastinal or hilar lymphadenopathy. --No axillary lymphadenopathy. --No supraclavicular lymphadenopathy. --Normal thyroid gland. --The esophagus is unremarkable Lungs/Pleura: There are severe emphysematous changes bilaterally with large apical bulla bilaterally. A left-sided chest tube is in place. There is a small to moderate sized left-sided pneumothorax. There is a cavitary lesion in the left upper lobe measuring approximately 3.7 by 2.7 cm. This lesion is tethered to the pleural surface. There may be a small soft tissue density filling defect within the dependent portion of this lesion. The trachea is unremarkable. Upper Abdomen: No acute  abnormality. Musculoskeletal: No chest wall abnormality. No acute or significant osseous findings. Review of the MIP images confirms the above findings. IMPRESSION: 1. Small left-sided pneumothorax. A left-sided chest tube is in place. 2. Severe emphysematous changes bilaterally with large biapical bulla. 3. Cavitary lesion in the left upper lobe as detailed above, likely represents the patient's reported lung cancer. No prior imaging is available. Correlation with patient history and prior outside studies is recommended for further evaluation. Aortic Atherosclerosis (ICD10-I70.0) and Emphysema (ICD10-J43.9). Electronically Signed   By: Constance Holster M.D.   On: 04/06/2019 21:26     CBC Recent Labs  Lab 04/06/19 1951 04/07/19 0946  WBC 11.7* 8.5  HGB 15.0 14.9  HCT 43.5 43.1  PLT 302 275  MCV 93.8 94.1  MCH 32.3 32.5  MCHC 34.5 34.6  RDW 13.1 13.1  LYMPHSABS 2.7  --   MONOABS 0.9  --   EOSABS 0.4  --   BASOSABS 0.1  --     Chemistries  Recent Labs  Lab 04/06/19 1951 04/07/19 0946  NA 135 138  K 3.9 4.6  CL 99 102  CO2 24 25  GLUCOSE 106* 95  BUN 13 13  CREATININE 0.87 0.97  CALCIUM 9.4 9.7  MG 1.9  --   AST 18  --   ALT 15  --   ALKPHOS 95  --   BILITOT 0.4  --    ------------------------------------------------------------------------------------------------------------------ No results for input(s): CHOL, HDL, LDLCALC, TRIG, CHOLHDL, LDLDIRECT in the last 72 hours.  No results found for: HGBA1C ------------------------------------------------------------------------------------------------------------------ Recent Labs    04/06/19 1951  TSH 0.987   ------------------------------------------------------------------------------------------------------------------ No results for input(s): VITAMINB12, FOLATE, FERRITIN, TIBC, IRON, RETICCTPCT in the last 72 hours.  Coagulation profile No results for input(s): INR, PROTIME in the last 168 hours.  No results  for input(s): DDIMER in the last 72 hours.  Cardiac Enzymes No results for input(s): CKMB, TROPONINI, MYOGLOBIN in the last 168 hours.  Invalid input(s): CK ------------------------------------------------------------------------------------------------------------------ No results found for: BNP   Roxan Hockey M.D on 04/07/2019 at 4:20 PM  Go to [email or web address] - for contact info  [facility] - Office  [phone]

## 2019-04-07 NOTE — Plan of Care (Signed)
Pain better with pain medication given routinely.  Sao2 wnl  on room air. Chest tube to suction. Still with air leak.  Will discuss options moving forward with CV surgery.
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
## 2019-04-08 ENCOUNTER — Inpatient Hospital Stay (HOSPITAL_COMMUNITY): Payer: No Typology Code available for payment source

## 2019-04-08 DIAGNOSIS — J9311 Primary spontaneous pneumothorax: Secondary | ICD-10-CM

## 2019-04-08 HISTORY — PX: CHEST TUBE INSERTION: SHX231

## 2019-04-08 MED ORDER — GENERIC EXTERNAL MEDICATION
Status: DC
Start: ? — End: 2019-04-08

## 2019-04-08 NOTE — Consult Note (Signed)
[address]       [place] [address]             [phone]        Tilden L Konkel [place] Medical Record #[id number] Date of Birth: 03/07/1956  Referring: No ref. provider found Primary Care: System, Pcp Not In Primary Cardiologist:No primary care provider on file.  Chief Complaint:   No chief complaint on file. "Pleurisy" History of Present Illness:     63 year old gentleman with a history of left lung cancer status post definitive radiation was in usual state of health until approximately 1 week ago when he experienced relatively acute onset of right followed by left chest pain.  He presented to the [facility] clinic in [place] for evaluation on 04/01/2019.  CT scan of the chest was done which demonstrated pneumothorax on the left.  He was advised to present to the [facility] facility across the drive.  He was admitted and had a chest tube placed.  The patient spent 5 days in the hospital at [facility] but became dissatisfied with care and requested transfer to [facility] where he had undergone his radiation therapy treatment.  He arrived in stable condition with a chest tube in place.  Patient denies any fevers.  He he states that his pain has essentially resolved.  He reports a decreased appetite and weight loss recently.   Current Activity/ Functional Status: Patient is independent with mobility/ambulation, transfers, ADL's, IADL's.   Zubrod Score: At the time of surgery this patients most appropriate activity status/level should be described as: [x]     0    Normal activity, no symptoms []     1    Restricted in physical strenuous activity but ambulatory, able to do out light work []     2    Ambulatory and capable of self care, unable to do work activities, up and about                 more than 50%  Of the time                            []     3    Only limited self care, in bed greater than 50% of waking hours []     4    Completely disabled,  no self care, confined to bed or chair []     5    Moribund  Past Medical History:  Diagnosis Date   Cancer [facility])    History of radiation therapy 08/29/16, 08/31/16, 09/02/16   Left upper lung / 54 Gy in 3 fractions   Lung nodule     Past Surgical History:  Procedure Laterality Date   APPENDECTOMY     CHEST TUBE INSERTION  1981   collapsed left lung   HERNIA REPAIR     x3   lower back     L1 and L5   neck fusion      Social History   Tobacco Use  Smoking Status Former Smoker   Packs/day: 0.75   Types: Cigarettes   Start date: 59   Quit date: 07/07/2014   Years since quitting: 4.7  Smokeless Tobacco Never Used    Social History   Substance and Sexual Activity  Alcohol Use No     Allergies  Allergen Reactions   Codeine Palpitations    Current Facility-Administered Medications  Medication Dose Route Frequency  Provider Last Rate Last Dose   cyclobenzaprine (FLEXERIL) tablet 2.5-5 mg  2.5-5 mg Oral Daily PRN Dana Allan I, MD   5 mg at 04/07/19 1031   enoxaparin (LOVENOX) injection 40 mg  40 mg Subcutaneous Q24H Dana Allan I, MD   40 mg at 04/07/19 2104   feeding supplement (ENSURE ENLIVE) (ENSURE ENLIVE) liquid 237 mL  237 mL Oral Daily Dana Allan I, MD       HYDROcodone-acetaminophen (NORCO) 10-325 MG per tablet 1 tablet  1 tablet Oral Q4H PRN Dana Allan I, MD   1 tablet at 04/08/19 0543   ipratropium-albuterol (DUONEB) 0.5-2.5 (3) MG/3ML nebulizer solution 3 mL  3 mL Nebulization Q4H PRN Dana Allan I, MD       lidocaine (LIDODERM) 5 % 1 patch  1 patch Transdermal Q24H Dana Allan I, MD   1 patch at 04/07/19 2104   megestrol (MEGACE) 400 MG/10ML suspension 400 mg  400 mg Oral Daily Emokpae, Courage, MD   400 mg at 04/07/19 2346   mirtazapine (REMERON) tablet 15 mg  15 mg Oral QHS Emokpae, Courage, MD   15 mg at 04/07/19 2103    Medications Prior to Admission  Medication Sig Dispense Refill Last Dose    cimetidine (TAGAMET) 200 MG tablet Take 200 mg by mouth daily as needed (heartburn).   03/31/2019   cyclobenzaprine (FLEXERIL) 5 MG tablet Take 2.5-5 mg by mouth daily as needed for muscle spasms.    couple weeks ago   ENSURE PLUS (ENSURE PLUS) LIQD Take 237 mLs by mouth daily.    couple weeks ago   oxyCODONE-acetaminophen (PERCOCET) 10-325 MG tablet Take 1 tablet by mouth 3 (three) times daily.    04/01/2019 at am    Family History  Problem Relation Age of Onset   Lung cancer Father      Review of Systems:   ROS Pertinent items are noted in HPI.     Cardiac Review of Systems: Y or  [    ]= no  Chest Pain [    ]  Resting SOB [   ] Exertional SOB  [  ]  Orthopnea [  ]   Pedal Edema [   ]    Palpitations [  ] Syncope  [  ]   Presyncope [   ]  General Review of Systems: [Y] = yes [  ]=no Constitional: recent weight change [  ]; anorexia [  ]; fatigue [  ]; nausea [  ]; night sweats [  ]; fever [  ]; or chills [  ]                                                               Dental: Last Dentist visit:   Eye : blurred vision [  ]; diplopia [   ]; vision changes [  ];  Amaurosis fugax[  ]; Resp: cough [  ];  wheezing[  ];  hemoptysis[  ]; shortness of breath[  ]; paroxysmal nocturnal dyspnea[  ]; dyspnea on exertion[  ]; or orthopnea[  ];  GI:  gallstones[  ], vomiting[  ];  dysphagia[  ]; melena[  ];  hematochezia [  ]; heartburn[  ];   Hx of  Colonoscopy[  ]; GU: kidney  stones [  ]; hematuria[  ];   dysuria [  ];  nocturia[  ];  history of     obstruction [  ]; urinary frequency [  ]             Skin: rash, swelling[  ];, hair loss[  ];  peripheral edema[  ];  or itching[  ]; Musculosketetal: myalgias[  ];  joint swelling[  ];  joint erythema[  ];  joint pain[  ];  back pain[  ];  Heme/Lymph: bruising[  ];  bleeding[  ];  anemia[  ];  Neuro: TIA[  ];  headaches[  ];  stroke[  ];  vertigo[  ];  seizures[  ];   paresthesias[  ];  difficulty walking[  ];  Psych:depression[  ];  anxiety[  ];  Endocrine: diabetes[  ];  thyroid dysfunction[  ];      Physical Exam: BP 127/87 (BP Location: Right Leg)    Pulse 83    Temp (!) 97.5 F (36.4 C) (Oral)    Resp 17    SpO2 99%    General appearance: alert, cooperative and no distress Head: Normocephalic, without obvious abnormality, atraumatic Lymph nodes: Cervical, supraclavicular, and axillary nodes normal. Resp: diminished breath sounds bilaterally Cardio: regular rate and rhythm, S1, S2 normal, no murmur, click, rub or gallop Extremities: extremities normal, atraumatic, no cyanosis or edema  Diagnostic Studies & Laboratory data:     Recent Radiology Findings:   Ct Chest Wo Contrast  Result Date: 04/06/2019 CLINICAL DATA:  Pneumothorax follow-up EXAM: CT CHEST WITHOUT CONTRAST TECHNIQUE: Multidetector CT imaging of the chest was performed following the standard protocol without IV contrast. COMPARISON:  None. FINDINGS: Cardiovascular: The heart size is normal. Coronary artery calcifications are noted. Atherosclerotic changes are noted of the thoracic aorta. There is no large pericardial effusion. Mediastinum/Nodes: --No mediastinal or hilar lymphadenopathy. --No axillary lymphadenopathy. --No supraclavicular lymphadenopathy. --Normal thyroid gland. --The esophagus is unremarkable Lungs/Pleura: There are severe emphysematous changes bilaterally with large apical bulla bilaterally. A left-sided chest tube is in place. There is a small to moderate sized left-sided pneumothorax. There is a cavitary lesion in the left upper lobe measuring approximately 3.7 by 2.7 cm. This lesion is tethered to the pleural surface. There may be a small soft tissue density filling defect within the dependent portion of this lesion. The trachea is unremarkable. Upper Abdomen: No acute abnormality. Musculoskeletal: No chest wall abnormality. No acute or significant osseous findings. Review of the MIP images confirms the above findings. IMPRESSION: 1.  Small left-sided pneumothorax. A left-sided chest tube is in place. 2. Severe emphysematous changes bilaterally with large biapical bulla. 3. Cavitary lesion in the left upper lobe as detailed above, likely represents the patient's reported lung cancer. No prior imaging is available. Correlation with patient history and prior outside studies is recommended for further evaluation. Aortic Atherosclerosis (ICD10-I70.0) and Emphysema (ICD10-J43.9). Electronically Signed   By: Constance Holster M.D.   On: 04/06/2019 21:26     I have independently reviewed the above radiologic studies and discussed with the patient   Recent Lab Findings: Lab Results  Component Value Date   WBC 8.5 04/07/2019   HGB 14.9 04/07/2019   HCT 43.1 04/07/2019   PLT 275 04/07/2019   GLUCOSE 95 04/07/2019   ALT 15 04/06/2019   AST 18 04/06/2019   NA 138 04/07/2019   K 4.6 04/07/2019   CL 102 04/07/2019   CREATININE 0.97 04/07/2019   BUN 13 04/07/2019  CO2 25 04/07/2019   TSH 0.987 04/06/2019      Assessment / Plan:      63 yo man with severe bullous emphysema and history of radiation therapy for presumed left upper lobe non-small cell lung cancer.  He is now being treated for spontaneous pneumothorax.  He appears a marginal surgical candidate due to cachexia and the anatomy of the lung.  Will consider endobronchial valves.  Have discussed with Dr. Merilynn Finland who has kindly agreed to evaluate.    I  spent 30 minutes counseling the patient face to face.   Boen Sterbenz Z. Orvan Seen, MD 04/08/2019 7:04 AM

## 2019-04-08 NOTE — Progress Notes (Addendum)
Pt tolerated well with pain, scale 5/10. Norco given q 4 hrs per order. Chest tube with -20 cmH2o wall suction, had minimal air leak with serosanguinous drainage, day shift recorded output 0 ml, but verbally reported 225 ml.? No subcutaneous emphysema, dressing clean,and intact. His vital remained normal , breathing normal, no acute distress noted, SPO2  96-97% with room air. Continue to monitor.  Kennyth Lose, RN

## 2019-04-08 NOTE — Progress Notes (Addendum)
Pt having increased CT air leak tonight per RN. Pt is not in any respiratory distress and his O2 sat is normal. CXR r/p states the CT is no longer in the lung. Called CT surgery who is following the pt. Dr. Dr on call ordered removal of CT with applied dressing. Stated to have pigtail CT and CT tray at bedside and to call back if pt becomes respiratory distressed.  Supplies to obtain and plan communicated to RN. Last VS-temp 97.6, HR 87, RR 18, Sao2 99%, BP 153/89.  KJKG, NP [facility]

## 2019-04-08 NOTE — Progress Notes (Signed)
Patient reported having a strong cough and felt "air come out around tube". Left chest tube appears to have a larger leak from previous assessment at 2000. Aggressive bubbling in chamber. Patient resting comfortably, no distress, 02 Sats 98% on RA. NSR on monitor. [facility] paged FYI. Stat chest X-Ray was ordered. Results pending.   RN will continue to monitor.

## 2019-04-08 NOTE — Progress Notes (Signed)
Patient Demographics:    Dillon Olson, is a 63 y.o. male, DOB - Oct 28, 1955, OVF:[id number]  Admit date - 04/06/2019   Admitting Physician Bonnell Public, MD  Outpatient Primary MD for the patient is System, Pcp Not In  LOS - 2   No chief complaint on file.       Subjective:    Dillon Olson today has no fevers, no emesis, no productive cough, oral intake is fair  Assessment  & Plan :    Principal Problem:   Pneumothorax on left Active Problems:   Solitary pulmonary nodule/presumed left lung cancer-status post prior radiation therapy   COPD with emphysema/Bleps   FTT (failure to thrive) in adult/left lung cancer related  Brief Summary:- 62 year old male reformed smoker with past medical history relevant for presumed left lung cancer not amenable to biopsy that was empirically treated with radiation therapy- previously undergone SBRT for presumed Stage 1A NSCLC in '17 (no biopsy).  Admitted to [facility] in [place] on 04/01/2019 with pneumothorax, status post chest tube placement at outside facility. Transferred from [facility] in [place] to [place]  on 04/06/2019 with persistent air leak from the left chest tube requiring CT surgery consult  --presumed left upper lobe non-small cell lung cancer- s/p Prior Ling Radiation -Now being treated for spontaneous pneumothorax- with Lt sided Chest tube with persistent leak-  CT surgery to decide on VATS Versus Endobronchial valves.  A/p 1)Lt Sided spontaneous hydropneumothorax--- status post chest tube placement at [facility] in [place] on 04/01/2019 with persistent air leak, CT surgery consult appreciated-- ,  As per Dr Orvan Seen (CT Surgery) pt may need VATS bleb stapling and/or pleurodesis Versus  Endobronchial valves. -Awaiting further consultation from Dr. Roxan Hockey -Repeat chest x-ray from 04/08/2019 with persistent small left  hydropneumothorax  2)Presumed non-small cell left lung cancer-- Obtain records from patient oncologist at the [facility] at [facility]-  --Previously treated with radiation  3)FTT/malignancy related cachexia--- Megace as ordered, c/n Remeron nightly, Ensure nutritional supplements  4) COPD/emphysema with blebs--- no frank COPD exacerbation at this time, continue bronchodilators  Disposition/Need for in-Hospital Stay- patient unable to be discharged at this time due to Lt sided  Hydropneumothorax requiring left-sided chest tube placement--awaiting more definitive surgical intervention by CT surgery  Code Status : Full  Family Communication:   NA (patient is alert, awake and coherent)  Disposition Plan  : TBD  Consults  :  CT surgery  DVT Prophylaxis  :  Lovenox - - SCDs    Lab Results  Component Value Date   PLT 275 04/07/2019    Inpatient Medications  Scheduled Meds:  enoxaparin (LOVENOX) injection  40 mg Subcutaneous Q24H   feeding supplement (ENSURE ENLIVE)  237 mL Oral Daily   lidocaine  1 patch Transdermal Q24H   megestrol  400 mg Oral Daily   mirtazapine  15 mg Oral QHS   Continuous Infusions: PRN Meds:.cyclobenzaprine, HYDROcodone-acetaminophen, ipratropium-albuterol    Anti-infectives (From admission, onward)   None        Objective:   Vitals:   04/08/19 0927 04/08/19 0957 04/08/19 1403 04/08/19 1414  BP:  (!) 153/90  117/83  Pulse: (!) 102 90 82 84  Resp: 18 15 12 17   Temp:  98 F (36.7 C)  97.9 F (36.6 C)  TempSrc:  Oral  Oral  SpO2: 99% 98% 96% 100%    Wt Readings from Last 3 Encounters:  11/05/18 51.4 kg  05/07/18 48.3 kg  11/07/17 51 kg     Intake/Output Summary (Last 24 hours) at 04/08/2019 1735 Last data filed at 04/08/2019 1415 Gross per 24 hour  Intake 490 ml  Output 800 ml  Net -310 ml     Physical Exam  Gen:- Awake Alert,  In no apparent distress  HEENT:- [place].AT, No sclera icterus Neck-Supple Neck,No  JVD,.  Nose- Post 2 L/min Lungs-left-sided chest tube, diminished breath sounds on the left CV- S1, S2 normal, regular  Abd-  +ve B.Sounds, Abd Soft, No tenderness,    Extremity/Skin:- No  edema, pedal pulses present  Psych-affect is appropriate, oriented x3 Neuro-no new focal deficits, no tremors   Data Review:   Micro Results Recent Results (from the past 240 hour(s))  SARS Coronavirus 2 (CEPHEID - Performed in [place] hospital lab), Hosp Order     Status: None   Collection Time: 04/07/19 10:30 AM   Specimen: Nasopharyngeal Swab  Result Value Ref Range Status   SARS Coronavirus 2 NEGATIVE NEGATIVE Final    Comment: (NOTE) If result is NEGATIVE SARS-CoV-2 target nucleic acids are NOT DETECTED. The SARS-CoV-2 RNA is generally detectable in upper and lower  respiratory specimens during the acute phase of infection. The lowest  concentration of SARS-CoV-2 viral copies this assay can detect is 250  copies / mL. A negative result does not preclude SARS-CoV-2 infection  and should not be used as the sole basis for treatment or other  patient management decisions.  A negative result may occur with  improper specimen collection / handling, submission of specimen other  than nasopharyngeal swab, presence of viral mutation(s) within the  areas targeted by this assay, and inadequate number of viral copies  (<250 copies / mL). A negative result must be combined with clinical  observations, patient history, and epidemiological information. If result is POSITIVE SARS-CoV-2 target nucleic acids are DETECTED. The SARS-CoV-2 RNA is generally detectable in upper and lower  respiratory specimens dur ing the acute phase of infection.  Positive  results are indicative of active infection with SARS-CoV-2.  Clinical  correlation with patient history and other diagnostic information is  necessary to determine patient infection status.  Positive results do  not rule out bacterial infection or  co-infection with other viruses. If result is PRESUMPTIVE POSTIVE SARS-CoV-2 nucleic acids MAY BE PRESENT.   A presumptive positive result was obtained on the submitted specimen  and confirmed on repeat testing.  While 2019 novel coronavirus  (SARS-CoV-2) nucleic acids may be present in the submitted sample  additional confirmatory testing may be necessary for epidemiological  and / or clinical management purposes  to differentiate between  SARS-CoV-2 and other Sarbecovirus currently known to infect humans.  If clinically indicated additional testing with an alternate test  methodology [phone]) is advised. The SARS-CoV-2 RNA is generally  detectable in upper and lower respiratory sp ecimens during the acute  phase of infection. The expected result is Negative. Fact Sheet for Patients:  StrictlyIdeas.no Fact Sheet for Healthcare Providers: BankingDealers.co.za This test is not yet approved or cleared by the [place] FDA and has been authorized for detection and/or diagnosis of SARS-CoV-2 by FDA under an Emergency Use Authorization (EUA).  This EUA will remain in effect (meaning this test can be used) for the  duration of the COVID-19 declaration under Section 564(b)(1) of the Act, 21 U.S.C. section 360bbb-3(b)(1), unless the authorization is terminated or revoked sooner. Performed at [facility], [place] [address] [place], [place] [address]     Radiology Reports Dg Chest 2 View  Result Date: 04/08/2019 CLINICAL DATA:  Left pneumothorax with a left-sided chest tube. EXAM: CHEST - 2 VIEW COMPARISON:  04/06/2019 FINDINGS: Cavitary lesion in the left upper lobe again noted with surrounding fibrotic changes. Small left apical pneumothorax. Left-sided chest tube in satisfactory position. Small left pleural effusion. Bilateral emphysematous changes again noted. Right perihilar fibrotic changes. Bilateral bullous disease. No focal  consolidation. Stable cardiomediastinal silhouette. No aggressive osseous lesion. IMPRESSION: 1. Left-sided chest tube in satisfactory position. Persistent small left hydropneumothorax. Electronically Signed   By: Kathreen Devoid   On: 04/08/2019 09:24   Ct Chest Wo Contrast  Result Date: 04/06/2019 CLINICAL DATA:  Pneumothorax follow-up EXAM: CT CHEST WITHOUT CONTRAST TECHNIQUE: Multidetector CT imaging of the chest was performed following the standard protocol without IV contrast. COMPARISON:  None. FINDINGS: Cardiovascular: The heart size is normal. Coronary artery calcifications are noted. Atherosclerotic changes are noted of the thoracic aorta. There is no large pericardial effusion. Mediastinum/Nodes: --No mediastinal or hilar lymphadenopathy. --No axillary lymphadenopathy. --No supraclavicular lymphadenopathy. --Normal thyroid gland. --The esophagus is unremarkable Lungs/Pleura: There are severe emphysematous changes bilaterally with large apical bulla bilaterally. A left-sided chest tube is in place. There is a small to moderate sized left-sided pneumothorax. There is a cavitary lesion in the left upper lobe measuring approximately 3.7 by 2.7 cm. This lesion is tethered to the pleural surface. There may be a small soft tissue density filling defect within the dependent portion of this lesion. The trachea is unremarkable. Upper Abdomen: No acute abnormality. Musculoskeletal: No chest wall abnormality. No acute or significant osseous findings. Review of the MIP images confirms the above findings. IMPRESSION: 1. Small left-sided pneumothorax. A left-sided chest tube is in place. 2. Severe emphysematous changes bilaterally with large biapical bulla. 3. Cavitary lesion in the left upper lobe as detailed above, likely represents the patient's reported lung cancer. No prior imaging is available. Correlation with patient history and prior outside studies is recommended for further evaluation. Aortic Atherosclerosis  (ICD10-I70.0) and Emphysema (ICD10-J43.9). Electronically Signed   By: Constance Holster M.D.   On: 04/06/2019 21:26     CBC Recent Labs  Lab 04/06/19 1951 04/07/19 0946  WBC 11.7* 8.5  HGB 15.0 14.9  HCT 43.5 43.1  PLT 302 275  MCV 93.8 94.1  MCH 32.3 32.5  MCHC 34.5 34.6  RDW 13.1 13.1  LYMPHSABS 2.7  --   MONOABS 0.9  --   EOSABS 0.4  --   BASOSABS 0.1  --     Chemistries  Recent Labs  Lab 04/06/19 1951 04/07/19 0946  NA 135 138  K 3.9 4.6  CL 99 102  CO2 24 25  GLUCOSE 106* 95  BUN 13 13  CREATININE 0.87 0.97  CALCIUM 9.4 9.7  MG 1.9  --   AST 18  --   ALT 15  --   ALKPHOS 95  --   BILITOT 0.4  --    ------------------------------------------------------------------------------------------------------------------ No results for input(s): CHOL, HDL, LDLCALC, TRIG, CHOLHDL, LDLDIRECT in the last 72 hours.  No results found for: HGBA1C ------------------------------------------------------------------------------------------------------------------ Recent Labs    04/06/19 1951  TSH 0.987   ------------------------------------------------------------------------------------------------------------------ No results for input(s): VITAMINB12, FOLATE, FERRITIN, TIBC, IRON, RETICCTPCT in the last 72  hours.  Coagulation profile No results for input(s): INR, PROTIME in the last 168 hours.  No results for input(s): DDIMER in the last 72 hours.  Cardiac Enzymes No results for input(s): CKMB, TROPONINI, MYOGLOBIN in the last 168 hours.  Invalid input(s): CK ------------------------------------------------------------------------------------------------------------------ No results found for: BNP   Roxan Hockey M.D on 04/08/2019 at 5:35 PM  Go to [email or web address] - for contact info  [facility] - Office  [phone]

## 2019-04-09 ENCOUNTER — Other Ambulatory Visit: Payer: Self-pay

## 2019-04-09 ENCOUNTER — Inpatient Hospital Stay (HOSPITAL_COMMUNITY): Payer: No Typology Code available for payment source

## 2019-04-09 ENCOUNTER — Encounter (HOSPITAL_COMMUNITY): Payer: Self-pay | Admitting: General Practice

## 2019-04-09 DIAGNOSIS — J9311 Primary spontaneous pneumothorax: Secondary | ICD-10-CM

## 2019-04-09 NOTE — Progress Notes (Signed)
PROGRESS NOTE    Dillon Olson  HWK:[id number] DOB: 1956/07/12 DOA: 04/06/2019 PCP: System, Pcp Not In   Brief Narrative:  63 year old male reformed smoker with past medical history relevant for presumed left lung cancer not amenable to biopsy that was empirically treated with radiation therapy- previously undergone SBRT for presumed Stage 1A NSCLC in '17 (no biopsy).  Admitted to [facility] in [place] on 04/01/2019 with pneumothorax, status post chest tube placement at outside facility. Transferred from [facility] in [place] to [place]  on 04/06/2019 with persistent air leak from the left chest tube requiring CT surgery consult  --presumed left upper lobe non-small cell lung cancer- s/p Prior Ling Radiation -Now being treated for spontaneous pneumothorax- with Lt sided Chest tube with persistent leak-  CT surgery to decide on VATS Versus Endobronchial valves.  7/28: Patient noted to have increased chest tube airleak overnight without any respiratory distress.  CT surgery had noted that chest tube was dislodged and has replaced it this morning.  Assessment & Plan:   Principal Problem:   Pneumothorax on left Active Problems:   Solitary pulmonary nodule/presumed left lung cancer-status post prior radiation therapy   COPD with emphysema/Bleps   FTT (failure to thrive) in adult/left lung cancer related   1)Lt Sided spontaneous hydropneumothorax--- status post chest tube placement at [facility] in [place] on 04/01/2019 with persistent air leak, CT surgery consult appreciated-- ,  As per Dr Orvan Seen (CT Surgery) pt may need VATS bleb stapling and/or pleurodesis Versus  Endobronchial valves. -Replacement chest tube today due to persistent air leak per CT surgery  2)Presumed non-small cell left lung cancer-- Obtain records from patient oncologist at the [facility] and [facility]- --Previously treated with radiation  3)FTT/malignancy related cachexia---  Megace as ordered, c/n Remeron nightly, Ensure nutritional supplements  4) COPD/emphysema with blebs--- no frank COPD exacerbation at this time, continue bronchodilators  Disposition/Need for in-Hospital Stay- patient unable to be discharged at this time due to Lt sided  Hydropneumothorax requiring left-sided chest tube placement--awaiting more definitive surgical intervention by CT surgery   DVT prophylaxis:Lovenox Code Status: Full Family Communication: None at bedside Disposition Plan: Per CT Surgery   Consultants:   CT surgery  Procedures:   CT placement  Replacement 7/28  Antimicrobials:  Anti-infectives (From admission, onward)   None       Subjective: Patient seen and evaluated today with no new acute complaints or concerns. No acute concerns or events noted overnight.  Patient did have increased air leak with dislodgment of CT overnight.  CT surgery did replace this morning.  Objective: Vitals:   04/08/19 1806 04/08/19 2025 04/09/19 0523 04/09/19 0910  BP:  (!) 153/89 136/82 (!) 142/87  Pulse: 82 87 84 97  Resp: 20 18 18    Temp:  97.6 F (36.4 C) 97.7 F (36.5 C)   TempSrc:  Oral Oral   SpO2: 99% 99% 95% 98%    Intake/Output Summary (Last 24 hours) at 04/09/2019 1235 Last data filed at 04/09/2019 0523 Gross per 24 hour  Intake 360 ml  Output 700 ml  Net -340 ml   There were no vitals filed for this visit.  Examination:  General exam: Appears calm and comfortable  Respiratory system: Clear to auscultation. Respiratory effort normal.  Chest tube dislodged from left chest wall.  Dressings clean dry and intact. Cardiovascular system: S1 & S2 heard, RRR. No JVD, murmurs, rubs, gallops or clicks. No pedal edema. Gastrointestinal system: Abdomen is nondistended, soft and  nontender. No organomegaly or masses felt. Normal bowel sounds heard. Central nervous system: Alert and oriented. No focal neurological deficits. Extremities: Symmetric 5 x 5 power.  Skin: No rashes, lesions or ulcers Psychiatry: Judgement and insight appear normal. Mood & affect appropriate.     Data Reviewed: I have personally reviewed following labs and imaging studies  CBC: Recent Labs  Lab 04/06/19 1951 04/07/19 0946  WBC 11.7* 8.5  NEUTROABS 7.6  --   HGB 15.0 14.9  HCT 43.5 43.1  MCV 93.8 94.1  PLT 302 604   Basic Metabolic Panel: Recent Labs  Lab 04/06/19 1951 04/07/19 0946  NA 135 138  K 3.9 4.6  CL 99 102  CO2 24 25  GLUCOSE 106* 95  BUN 13 13  CREATININE 0.87 0.97  CALCIUM 9.4 9.7  MG 1.9  --   PHOS 3.7  --    GFR: CrCl cannot be calculated (Unknown ideal weight.). Liver Function Tests: Recent Labs  Lab 04/06/19 1951  AST 18  ALT 15  ALKPHOS 95  BILITOT 0.4  PROT 6.9  ALBUMIN 3.6   No results for input(s): LIPASE, AMYLASE in the last 168 hours. No results for input(s): AMMONIA in the last 168 hours. Coagulation Profile: No results for input(s): INR, PROTIME in the last 168 hours. Cardiac Enzymes: No results for input(s): CKTOTAL, CKMB, CKMBINDEX, TROPONINI in the last 168 hours. BNP (last 3 results) No results for input(s): PROBNP in the last 8760 hours. HbA1C: No results for input(s): HGBA1C in the last 72 hours. CBG: No results for input(s): GLUCAP in the last 168 hours. Lipid Profile: No results for input(s): CHOL, HDL, LDLCALC, TRIG, CHOLHDL, LDLDIRECT in the last 72 hours. Thyroid Function Tests: Recent Labs    04/06/19 1951  TSH 0.987   Anemia Panel: No results for input(s): VITAMINB12, FOLATE, FERRITIN, TIBC, IRON, RETICCTPCT in the last 72 hours. Sepsis Labs: No results for input(s): PROCALCITON, LATICACIDVEN in the last 168 hours.  Recent Results (from the past 240 hour(s))  SARS Coronavirus 2 (CEPHEID - Performed in [place] hospital lab), Hosp Order     Status: None   Collection Time: 04/07/19 10:30 AM   Specimen: Nasopharyngeal Swab  Result Value Ref Range Status   SARS Coronavirus 2 NEGATIVE  NEGATIVE Final    Comment: (NOTE) If result is NEGATIVE SARS-CoV-2 target nucleic acids are NOT DETECTED. The SARS-CoV-2 RNA is generally detectable in upper and lower  respiratory specimens during the acute phase of infection. The lowest  concentration of SARS-CoV-2 viral copies this assay can detect is 250  copies / mL. A negative result does not preclude SARS-CoV-2 infection  and should not be used as the sole basis for treatment or other  patient management decisions.  A negative result may occur with  improper specimen collection / handling, submission of specimen other  than nasopharyngeal swab, presence of viral mutation(s) within the  areas targeted by this assay, and inadequate number of viral copies  (<250 copies / mL). A negative result must be combined with clinical  observations, patient history, and epidemiological information. If result is POSITIVE SARS-CoV-2 target nucleic acids are DETECTED. The SARS-CoV-2 RNA is generally detectable in upper and lower  respiratory specimens dur ing the acute phase of infection.  Positive  results are indicative of active infection with SARS-CoV-2.  Clinical  correlation with patient history and other diagnostic information is  necessary to determine patient infection status.  Positive results do  not rule out bacterial infection  or co-infection with other viruses. If result is PRESUMPTIVE POSTIVE SARS-CoV-2 nucleic acids MAY BE PRESENT.   A presumptive positive result was obtained on the submitted specimen  and confirmed on repeat testing.  While 2019 novel coronavirus  (SARS-CoV-2) nucleic acids may be present in the submitted sample  additional confirmatory testing may be necessary for epidemiological  and / or clinical management purposes  to differentiate between  SARS-CoV-2 and other Sarbecovirus currently known to infect humans.  If clinically indicated additional testing with an alternate test  methodology [phone]) is  advised. The SARS-CoV-2 RNA is generally  detectable in upper and lower respiratory sp ecimens during the acute  phase of infection. The expected result is Negative. Fact Sheet for Patients:  StrictlyIdeas.no Fact Sheet for Healthcare Providers: BankingDealers.co.za This test is not yet approved or cleared by the [place] FDA and has been authorized for detection and/or diagnosis of SARS-CoV-2 by FDA under an Emergency Use Authorization (EUA).  This EUA will remain in effect (meaning this test can be used) for the duration of the COVID-19 declaration under Section 564(b)(1) of the Act, 21 U.S.C. section 360bbb-3(b)(1), unless the authorization is terminated or revoked sooner. Performed at [facility], [address] [place], [place] [address]          Radiology Studies: Dg Chest 2 View  Result Date: 04/08/2019 CLINICAL DATA:  Left pneumothorax with a left-sided chest tube. EXAM: CHEST - 2 VIEW COMPARISON:  04/06/2019 FINDINGS: Cavitary lesion in the left upper lobe again noted with surrounding fibrotic changes. Small left apical pneumothorax. Left-sided chest tube in satisfactory position. Small left pleural effusion. Bilateral emphysematous changes again noted. Right perihilar fibrotic changes. Bilateral bullous disease. No focal consolidation. Stable cardiomediastinal silhouette. No aggressive osseous lesion. IMPRESSION: 1. Left-sided chest tube in satisfactory position. Persistent small left hydropneumothorax. Electronically Signed   By: Kathreen Devoid   On: 04/08/2019 09:24   Dg Chest 1v Repeat Same Day  Result Date: 04/09/2019 CLINICAL DATA:  63 year old male chest tube placement. Spontaneous left pneumothorax. EXAM: CHEST - 1 VIEW SAME DAY COMPARISON:  0743 hours today and earlier. FINDINGS: Portable AP upright view at 0917 hours. New pigtail type left chest tube in place. Only trace residual pneumothorax is visible along  the lateral margin of the descending aorta. Underlying hyperinflation and lung scarring with architectural distortion in the apices greater on the left. No pleural effusion or acute pulmonary opacity. Normal cardiac size and mediastinal contours. Visualized tracheal air column is within normal limits. No acute osseous abnormality identified. Prior cervical ACDF. Negative visible bowel gas pattern. IMPRESSION: 1. Left chest tube placed with substantially reduced left pneumothorax, trace residual. 2. Underlying chronic lung disease. Electronically Signed   By: Genevie Ann M.D.   On: 04/09/2019 09:58   Dg Chest Port 1 View  Result Date: 04/09/2019 CLINICAL DATA:  History of spontaneous left pneumothorax. EXAM: PORTABLE CHEST 1 VIEW COMPARISON:  Single-view of the chest 04/08/2019. FINDINGS: Left chest tube seen on yesterday's examination is no longer visualized. The tube was essentially outside the chest on yesterday's study. Left pneumothorax seen on yesterday's examination is increased and is now estimated at 30-40%. The lungs are severely emphysematous with distortion of the pulmonary architecture and scarring in the left upper lobe. Heart size is normal. No right pneumothorax. No pleural effusion. IMPRESSION: Increase in the size of a left pneumothorax now estimated at 30-40%. Severe emphysema. These results were called by telephone at the time of interpretation on 04/09/2019  at 8:07 am to the patient's nurse, Sonia Baller , who verbally acknowledged these results. Electronically Signed   By: Inge Rise M.D.   On: 04/09/2019 08:09   Dg Chest Port 1 View  Result Date: 04/08/2019 CLINICAL DATA:  Chest tube with air-leak and pneumothorax EXAM: PORTABLE CHEST 1 VIEW COMPARISON:  04/08/2019, CT chest 04/06/2019, PET CT 10/31/2017 FINDINGS: Left-sided chest tube appears withdrawn with the tip positioned over the left lateral lower chest wall soft tissues and side-port external to the patient. Spiculated opacity in the  left apex shows no change. Scattered spiculated opacities in the right apex are unchanged. Emphysematous disease with large bulla. Decreased fluid level at the left base. Residual pneumothorax medially and at the left lung base. Stable cardiomediastinal silhouette. Surgical hardware in the cervical spine. IMPRESSION: 1. Left-sided chest tube appears withdrawn, the tip appears to be positioned over the left lateral lower chest wall soft tissues with side port external to the patient. 2. Decreased fluid level at the left lung base. Small left basilar and medial pneumothorax 3. Emphysematous disease with large apical bulla. No change in spiculated opacity in the left upper lobe. These results will be called to the ordering clinician or representative by the Radiologist Assistant, and communication documented in the PACS or zVision Dashboard. Electronically Signed   By: Donavan Foil M.D.   On: 04/08/2019 23:37        Scheduled Meds: . enoxaparin (LOVENOX) injection  40 mg Subcutaneous Q24H  . feeding supplement (ENSURE ENLIVE)  237 mL Oral Daily  . lidocaine  1 patch Transdermal Q24H  . megestrol  400 mg Oral Daily  . mirtazapine  15 mg Oral QHS   Continuous Infusions:   LOS: 3 days    Time spent: 30 minutes    Jaiona Simien Darleen Crocker, DO [facility] Pager [phone]  If 7PM-7AM, please contact night-coverage [email or web address] Password [email or web address] 04/09/2019, 12:35 PM

## 2019-04-09 NOTE — Progress Notes (Addendum)
Chest tube removed without complications by Duarte. Patient tolerated removal well. 02 Sats 100 on Room Air, NSR 96. Will page if patient shows signs of respiratory distress. Chest Tube tray with pigtail at bedside per directions from on call provider.   Will continue to monitor.

## 2019-04-09 NOTE — Progress Notes (Signed)
      [address]       [place] [address]             [phone]       Subjective: CT noted to be outside chest last night and was removed Not significantly short of breath at rest  Objective: Vital signs in last 24 hours: Temp:  [97.6 F (36.4 C)-98 F (36.7 C)] 97.7 F (36.5 C) (07/28 0523) Pulse Rate:  [82-102] 84 (07/28 0523) Cardiac Rhythm: Normal sinus rhythm (07/28 0701) Resp:  [12-20] 18 (07/28 0523) BP: (117-153)/(82-90) 136/82 (07/28 0523) SpO2:  [95 %-100 %] 95 % (07/28 0523)  Hemodynamic parameters for last 24 hours:    Intake/Output from previous day: 07/27 0701 - 07/28 0700 In: 480 [P.O.:480] Out: 950 [Urine:950] Intake/Output this shift: No intake/output data recorded.  General appearance: alert, cooperative and no distress Neurologic: intact Heart: regular rate and rhythm Lungs: diminished breath sounds left  Lab Results: Recent Labs    04/06/19 1951 04/07/19 0946  WBC 11.7* 8.5  HGB 15.0 14.9  HCT 43.5 43.1  PLT 302 275   BMET:  Recent Labs    04/06/19 1951 04/07/19 0946  NA 135 138  K 3.9 4.6  CL 99 102  CO2 24 25  GLUCOSE 106* 95  BUN 13 13  CREATININE 0.87 0.97  CALCIUM 9.4 9.7    PT/INR: No results for input(s): LABPROT, INR in the last 72 hours. ABG No results found for: PHART, HCO3, TCO2, ACIDBASEDEF, O2SAT CBG (last 3)  No results for input(s): GLUCAP in the last 72 hours.  Assessment/Plan: S/P  -Spontaneous pneumothorax  CT noted to be out of chest last night and was removed  CXR this AM shows increased pneumothorax Needs new CT placed- he is aware of indications, risks, benefits and alternatives  He has severe upper lobe predominant bullous emphysema. His air leak in unlikely to stop on its own. He is a poor operative candidate due to the severity of his emphysema and nutritional status. He would likely benefit from endobronchial valve placement to attempt to get his air leak to resolve. He  understands this is an off label use of the product.   Dr Orvan Seen and I discussed the indications, risks, benefits and alternatives with him. He understands there are risks associated with general anesthesia. He understands there is no guarantee of success. He understands he will need another bronchoscopy in the future to remove the valves. He accepts the risks and agrees to proceed.  Dillon Standard Roxan Hockey, MD [facility] [phone]    LOS: 3 days    Dillon Olson 04/09/2019

## 2019-04-09 NOTE — Procedures (Signed)
Informed consent obtained  Sterile technique. Local anesthesia with 5 ml 1% lidocaine  67F pigtail catheter placed left pleural space using modified Seldinger technique.  Tolerated well  + air leak after placement  Remo Lipps C. Roxan Hockey, MD [facility] [phone]

## 2019-04-09 NOTE — Progress Notes (Signed)
Discussed current visitation policy w/ pt. Offered face time via IPAD or google DUo via iphone/android. Pt denied both. Offered support. He stated they both had their phones to use. ----JM

## 2019-04-09 NOTE — H&P (View-Only) (Signed)
      [address]       [place] [address]             [phone]       Subjective: CT noted to be outside chest last night and was removed Not significantly short of breath at rest  Objective: Vital signs in last 24 hours: Temp:  [97.6 F (36.4 C)-98 F (36.7 C)] 97.7 F (36.5 C) (07/28 0523) Pulse Rate:  [82-102] 84 (07/28 0523) Cardiac Rhythm: Normal sinus rhythm (07/28 0701) Resp:  [12-20] 18 (07/28 0523) BP: (117-153)/(82-90) 136/82 (07/28 0523) SpO2:  [95 %-100 %] 95 % (07/28 0523)  Hemodynamic parameters for last 24 hours:    Intake/Output from previous day: 07/27 0701 - 07/28 0700 In: 480 [P.O.:480] Out: 950 [Urine:950] Intake/Output this shift: No intake/output data recorded.  General appearance: alert, cooperative and no distress Neurologic: intact Heart: regular rate and rhythm Lungs: diminished breath sounds left  Lab Results: Recent Labs    04/06/19 1951 04/07/19 0946  WBC 11.7* 8.5  HGB 15.0 14.9  HCT 43.5 43.1  PLT 302 275   BMET:  Recent Labs    04/06/19 1951 04/07/19 0946  NA 135 138  K 3.9 4.6  CL 99 102  CO2 24 25  GLUCOSE 106* 95  BUN 13 13  CREATININE 0.87 0.97  CALCIUM 9.4 9.7    PT/INR: No results for input(s): LABPROT, INR in the last 72 hours. ABG No results found for: PHART, HCO3, TCO2, ACIDBASEDEF, O2SAT CBG (last 3)  No results for input(s): GLUCAP in the last 72 hours.  Assessment/Plan: S/P  -Spontaneous pneumothorax  CT noted to be out of chest last night and was removed  CXR this AM shows increased pneumothorax Needs new CT placed- he is aware of indications, risks, benefits and alternatives  He has severe upper lobe predominant bullous emphysema. His air leak in unlikely to stop on its own. He is a poor operative candidate due to the severity of his emphysema and nutritional status. He would likely benefit from endobronchial valve placement to attempt to get his air leak to resolve. He  understands this is an off label use of the product.   Dr Orvan Seen and I discussed the indications, risks, benefits and alternatives with him. He understands there are risks associated with general anesthesia. He understands there is no guarantee of success. He understands he will need another bronchoscopy in the future to remove the valves. He accepts the risks and agrees to proceed.  Revonda Standard Roxan Hockey, MD [facility] [phone]    LOS: 3 days    Melrose Nakayama 04/09/2019

## 2019-04-10 ENCOUNTER — Inpatient Hospital Stay (HOSPITAL_COMMUNITY): Payer: No Typology Code available for payment source

## 2019-04-10 ENCOUNTER — Encounter (HOSPITAL_COMMUNITY)
Admission: AD | Disposition: A | Discharge status: Home / Self Care | Payer: Self-pay | Source: Other Acute Inpatient Hospital | Attending: Cardiothoracic Surgery

## 2019-04-10 ENCOUNTER — Encounter (HOSPITAL_COMMUNITY): Payer: Self-pay | Admitting: Anesthesiology

## 2019-04-10 ENCOUNTER — Inpatient Hospital Stay (HOSPITAL_COMMUNITY): Payer: No Typology Code available for payment source | Admitting: Anesthesiology

## 2019-04-10 HISTORY — PX: VIDEO BRONCHOSCOPY WITH INSERTION OF INTERBRONCHIAL VALVE (IBV): SHX6178

## 2019-04-10 LAB — BASIC METABOLIC PANEL
Anion gap: 7 (ref 5–15)
BUN: 10 mg/dL (ref 8–23)
CO2: 24 mmol/L (ref 22–32)
Calcium: 9.3 mg/dL (ref 8.9–10.3)
Chloride: 106 mmol/L (ref 98–111)
Creatinine, Ser: 0.85 mg/dL (ref 0.61–1.24)
GFR calc Af Amer: >60 mL/min (ref >60)
GFR calc non Af Amer: >60 mL/min (ref >60)
Glucose, Bld: 103 mg/dL — ABNORMAL HIGH (ref 70–99)
Potassium: 4.2 mmol/L (ref 3.5–5.1)
Sodium: 137 mmol/L (ref 135–145)

## 2019-04-10 LAB — CBC
HCT: 39.4 % (ref 39.0–52.0)
Hemoglobin: 13.3 g/dL (ref 13.0–17.0)
MCH: 32 pg (ref 26.0–34.0)
MCHC: 33.8 g/dL (ref 30.0–36.0)
MCV: 94.9 fL (ref 80.0–100.0)
Platelets: 276 10*3/uL (ref 150–400)
RBC: 4.15 MIL/uL — ABNORMAL LOW (ref 4.22–5.81)
RDW: 13 % (ref 11.5–15.5)
WBC: 8.1 10*3/uL (ref 4.0–10.5)
nRBC: 0 % (ref 0.0–0.2)

## 2019-04-10 SURGERY — BRONCHOSCOPY, FLEXIBLE, WITH INTRABRONCHIAL VALVE INSERTION
Anesthesia: General

## 2019-04-10 MED ORDER — FENTANYL CITRATE (PF) 250 MCG/5ML IJ SOLN
INTRAMUSCULAR | Status: DC | PRN
  Administered 2019-04-10: 150 ug via INTRAVENOUS

## 2019-04-10 MED ORDER — ONDANSETRON HCL 4 MG/2ML IJ SOLN
INTRAMUSCULAR | Status: AC
  Filled 2019-04-10: qty 2

## 2019-04-10 MED ORDER — SUGAMMADEX SODIUM 200 MG/2ML IV SOLN
INTRAVENOUS | Status: DC | PRN
  Administered 2019-04-10: 400 mg via INTRAVENOUS

## 2019-04-10 MED ORDER — SUCCINYLCHOLINE CHLORIDE 200 MG/10ML IV SOSY
PREFILLED_SYRINGE | INTRAVENOUS | Status: AC
  Filled 2019-04-10: qty 10

## 2019-04-10 MED ORDER — MIDAZOLAM HCL 2 MG/2ML IJ SOLN
INTRAMUSCULAR | Status: AC
  Filled 2019-04-10: qty 2

## 2019-04-10 MED ORDER — HYDROCODONE-ACETAMINOPHEN 10-325 MG PO TABS
ORAL_TABLET | ORAL | Status: AC
  Filled 2019-04-10: qty 1

## 2019-04-10 MED ORDER — ONDANSETRON HCL 4 MG/2ML IJ SOLN
INTRAMUSCULAR | Status: DC | PRN
  Administered 2019-04-10: 4 mg via INTRAVENOUS

## 2019-04-10 MED ORDER — ROCURONIUM BROMIDE 10 MG/ML (PF) SYRINGE
PREFILLED_SYRINGE | INTRAVENOUS | Status: DC | PRN
  Administered 2019-04-10: 50 mg via INTRAVENOUS

## 2019-04-10 MED ORDER — LIDOCAINE 2% (20 MG/ML) 5 ML SYRINGE
INTRAMUSCULAR | Status: DC | PRN
  Administered 2019-04-10: 100 mg via INTRAVENOUS

## 2019-04-10 MED ORDER — DEXAMETHASONE SODIUM PHOSPHATE 10 MG/ML IJ SOLN
INTRAMUSCULAR | Status: AC
  Filled 2019-04-10: qty 1

## 2019-04-10 MED ORDER — ROCURONIUM BROMIDE 10 MG/ML (PF) SYRINGE
PREFILLED_SYRINGE | INTRAVENOUS | Status: AC
  Filled 2019-04-10: qty 10

## 2019-04-10 MED ORDER — GENERIC EXTERNAL MEDICATION
Status: DC
Start: ? — End: 2019-04-10

## 2019-04-10 MED ORDER — HYDROMORPHONE HCL 1 MG/ML IJ SOLN: 0.2500 mg | INTRAMUSCULAR | Status: DC | PRN

## 2019-04-10 MED ORDER — MEPERIDINE HCL 25 MG/ML IJ SOLN: 6.2500 mg | INTRAMUSCULAR | Status: DC | PRN

## 2019-04-10 MED ORDER — EPHEDRINE 5 MG/ML INJ
INTRAVENOUS | Status: AC
  Filled 2019-04-10: qty 10

## 2019-04-10 MED ORDER — ONDANSETRON HCL 4 MG/2ML IJ SOLN: 4.0000 mg | Freq: Once | INTRAMUSCULAR | Status: DC | PRN

## 2019-04-10 MED ORDER — LACTATED RINGERS IV SOLN
INTRAVENOUS | Status: DC
  Administered 2019-04-10 – 2019-04-17 (×3): via INTRAVENOUS

## 2019-04-10 MED ORDER — LIDOCAINE 2% (20 MG/ML) 5 ML SYRINGE
INTRAMUSCULAR | Status: AC
  Filled 2019-04-10: qty 5

## 2019-04-10 MED ORDER — PROPOFOL 10 MG/ML IV BOLUS
INTRAVENOUS | Status: AC
  Filled 2019-04-10: qty 20

## 2019-04-10 MED ORDER — DEXAMETHASONE SODIUM PHOSPHATE 10 MG/ML IJ SOLN
INTRAMUSCULAR | Status: DC | PRN
  Administered 2019-04-10: 8 mg via INTRAVENOUS

## 2019-04-10 MED ORDER — FENTANYL CITRATE (PF) 250 MCG/5ML IJ SOLN
INTRAMUSCULAR | Status: AC
  Filled 2019-04-10: qty 5

## 2019-04-10 MED ORDER — PROPOFOL 10 MG/ML IV BOLUS
INTRAVENOUS | Status: DC | PRN
  Administered 2019-04-10: 100 mg via INTRAVENOUS

## 2019-04-10 MED ORDER — PHENYLEPHRINE 40 MCG/ML (10ML) SYRINGE FOR IV PUSH (FOR BLOOD PRESSURE SUPPORT)
PREFILLED_SYRINGE | INTRAVENOUS | Status: AC
  Filled 2019-04-10: qty 10

## 2019-04-10 MED ORDER — ENSURE ENLIVE PO LIQD
237.0000 mL | Freq: Three times a day (TID) | ORAL | Status: DC
  Administered 2019-04-10 – 2019-04-21 (×24): 237 mL via ORAL

## 2019-04-10 MED ORDER — MIDAZOLAM HCL 5 MG/5ML IJ SOLN
INTRAMUSCULAR | Status: DC | PRN
  Administered 2019-04-10: 2 mg via INTRAVENOUS

## 2019-04-10 SURGICAL SUPPLY — 37 items
ADAPTER VALVE BIOPSY EBUS (MISCELLANEOUS) IMPLANT
ADPTR VALVE BIOPSY EBUS (MISCELLANEOUS)
CANISTER SUCT 3000ML PPV (MISCELLANEOUS) ×2 IMPLANT
CATH BALLN 4FR (CATHETERS) ×1 IMPLANT
CATH LOADER DEPLOYMENT HUD (CATHETERS) ×1 IMPLANT
CONT SPEC 4OZ CLIKSEAL STRL BL (MISCELLANEOUS) ×2 IMPLANT
COVER BACK TABLE 60X90IN (DRAPES) ×2 IMPLANT
COVER WAND RF STERILE (DRAPES) ×2 IMPLANT
FILTER STRAW FLUID ASPIR (MISCELLANEOUS) IMPLANT
FORCEPS BIOP RJ4 1.8 (CUTTING FORCEPS) IMPLANT
GAUZE SPONGE 4X4 12PLY STRL (GAUZE/BANDAGES/DRESSINGS) ×2 IMPLANT
GLOVE SURG SIGNA 7.5 PF LTX (GLOVE) ×2 IMPLANT
GOWN STRL REUS W/ TWL XL LVL3 (GOWN DISPOSABLE) ×1 IMPLANT
GOWN STRL REUS W/TWL XL LVL3 (GOWN DISPOSABLE) ×1
KIT AIRWAY SIZING W/B5-23 BALL (VALVE) ×1 IMPLANT
KIT CLEAN ENDO COMPLIANCE (KITS) ×2 IMPLANT
KIT TURNOVER KIT B (KITS) ×2 IMPLANT
MARKER SKIN DUAL TIP RULER LAB (MISCELLANEOUS) ×2 IMPLANT
NS IRRIG 1000ML POUR BTL (IV SOLUTION) ×2 IMPLANT
OIL SILICONE PENTAX (PARTS (SERVICE/REPAIRS)) IMPLANT
PAD ARMBOARD 7.5X6 YLW CONV (MISCELLANEOUS) ×4 IMPLANT
STOPCOCK MORSE 400PSI 3WAY (MISCELLANEOUS) ×2 IMPLANT
SYR 10ML LL (SYRINGE) ×2 IMPLANT
SYR 20ML ECCENTRIC (SYRINGE) ×2 IMPLANT
TOWEL GREEN STERILE (TOWEL DISPOSABLE) ×2 IMPLANT
TOWEL GREEN STERILE FF (TOWEL DISPOSABLE) ×2 IMPLANT
TOWEL NATURAL 4PK STERILE (DISPOSABLE) ×2 IMPLANT
TRAP SPECIMEN MUCOUS 40CC (MISCELLANEOUS) ×2 IMPLANT
TUBE CONNECTING 20X1/4 (TUBING) ×2 IMPLANT
UNDERPAD 30X30 (UNDERPADS AND DIAPERS) ×2 IMPLANT
VALVE BIOPSY  SINGLE USE (MISCELLANEOUS) ×1
VALVE BIOPSY SINGLE USE (MISCELLANEOUS) ×1 IMPLANT
VALVE IN CARTRIDGE 5MM HUD (Valve) ×1 IMPLANT
VALVE IN CARTRIDGE 7MM HUD (Valve) ×2 IMPLANT
VALVE IN CARTRIDGE 9MM HUD (Valve) ×1 IMPLANT
VALVE SUCTION BRONCHIO DISP (MISCELLANEOUS) ×2 IMPLANT
WATER STERILE IRR 1000ML POUR (IV SOLUTION) ×2 IMPLANT

## 2019-04-10 NOTE — Progress Notes (Signed)
PROGRESS NOTE    Dillon Olson  JQZ:[id number] DOB: May 27, 1956 DOA: 04/06/2019 PCP: System, Pcp Not In   Brief Narrative:  63 year old male reformed smoker with past medical history relevant for presumed left lung cancer not amenable to biopsy that was empirically treated with radiation therapy- previously undergone SBRT for presumed Stage 1A NSCLC in '17 (no biopsy). Admitted to [facility] in [place] on 04/01/2019 with pneumothorax, status post chest tube placement at outside facility. Transferred from [facility] in [place] to [place] on 04/06/2019 with persistent air leak from the left chest tube requiring CT surgery consult  --presumed left upper lobe non-small cell lung cancer- s/p Prior Ling Radiation -Now being treated for spontaneous pneumothorax- with Lt sided Chest tube with persistent leak- CT surgery to decide on VATS Versus Endobronchial valves.  7/28: Patient noted to have increased chest tube airleak overnight without any respiratory distress.  CT surgery had noted that chest tube was dislodged and has replaced it this morning.  7/29: Plans for patient undergo video bronchoscopy with insertion of intrabronchial valve today per CT surgery.  Assessment & Plan:   Principal Problem:   Pneumothorax on left Active Problems:   Solitary pulmonary nodule/presumed left lung cancer-status post prior radiation therapy   COPD with emphysema/Bleps   FTT (failure to thrive) in adult/left lung cancer related  1)Lt Sidedspontaneous hydropneumothorax--- status post chest tube placement at [facility] in [place] on 04/01/2019 with persistent air leak, CT surgery consult appreciated-- , As per Dr Orvan Seen (CT Surgery) pt may need VATS bleb stapling and/or pleurodesis VersusEndobronchial valves. -Replacement chest tube performed on 7/28 -Plans for video bronchoscopy with insertion of intrabronchial valve on 7/29  2)Presumednon-small cellleft lung cancer--  Obtain records from patient oncologist at the [facility] and [facility]- --Previously treated with radiation  3)FTT/malignancy related cachexia--- Megace as ordered,c/nRemeron nightly, Ensure nutritional supplements  4) COPD/emphysema with blebs---no frank COPD exacerbation at this time,continue bronchodilators  Disposition/Need for in-Hospital Stay- patient unable to be discharged at this time due toLt sided Hydropneumothorax requiring left-sided chest tube placement.  Definitive intervention of video bronchoscopy with insertion of intrabronchial valve today.   DVT prophylaxis:Lovenox Code Status: Full Family Communication: None at bedside Disposition Plan: Per CT Surgery   Consultants:   CT surgery  Procedures:   CT placement  Replacement 7/28  Video bronchoscopy with insertion of intrabronchial valve 7/29  Antimicrobials:  Anti-infectives (From admission, onward)   None       Subjective: Patient seen and evaluated today with no new acute complaints or concerns. No acute concerns or events noted overnight.  No significant shortness of breath or chest pain noted overnight after replacement of chest tube 7/28.  Objective: Vitals:   04/09/19 1444 04/09/19 1936 04/10/19 0503 04/10/19 0816  BP:  (!) 155/73 (!) 146/86 133/79  Pulse: 95 98 75 72  Resp: (!) 27 14 16 15   Temp:  98.2 F (36.8 C) 98.2 F (36.8 C) 97.8 F (36.6 C)  TempSrc:  Oral Oral Oral  SpO2: 96% 98% 98% 100%    Intake/Output Summary (Last 24 hours) at 04/10/2019 1126 Last data filed at 04/10/2019 0000 Gross per 24 hour  Intake -  Output 825 ml  Net -825 ml   There were no vitals filed for this visit.  Examination:  General exam: Appears calm and comfortable  Respiratory system: Clear to auscultation. Respiratory effort normal.  Currently on room air.  Chest tube to left chest wall noted. Cardiovascular system: S1 &  S2 heard, RRR. No JVD, murmurs, rubs, gallops or  clicks. No pedal edema. Gastrointestinal system: Abdomen is nondistended, soft and nontender. No organomegaly or masses felt. Normal bowel sounds heard. Central nervous system: Alert and oriented. No focal neurological deficits. Extremities: Symmetric 5 x 5 power. Skin: No rashes, lesions or ulcers Psychiatry: Judgement and insight appear normal. Mood & affect appropriate.     Data Reviewed: I have personally reviewed following labs and imaging studies  CBC: Recent Labs  Lab 04/06/19 1951 04/07/19 0946 04/10/19 0358  WBC 11.7* 8.5 8.1  NEUTROABS 7.6  --   --   HGB 15.0 14.9 13.3  HCT 43.5 43.1 39.4  MCV 93.8 94.1 94.9  PLT 302 275 833   Basic Metabolic Panel: Recent Labs  Lab 04/06/19 1951 04/07/19 0946 04/10/19 0358  NA 135 138 137  K 3.9 4.6 4.2  CL 99 102 106  CO2 24 25 24   GLUCOSE 106* 95 103*  BUN 13 13 10   CREATININE 0.87 0.97 0.85  CALCIUM 9.4 9.7 9.3  MG 1.9  --   --   PHOS 3.7  --   --    GFR: CrCl cannot be calculated (Unknown ideal weight.). Liver Function Tests: Recent Labs  Lab 04/06/19 1951  AST 18  ALT 15  ALKPHOS 95  BILITOT 0.4  PROT 6.9  ALBUMIN 3.6   No results for input(s): LIPASE, AMYLASE in the last 168 hours. No results for input(s): AMMONIA in the last 168 hours. Coagulation Profile: No results for input(s): INR, PROTIME in the last 168 hours. Cardiac Enzymes: No results for input(s): CKTOTAL, CKMB, CKMBINDEX, TROPONINI in the last 168 hours. BNP (last 3 results) No results for input(s): PROBNP in the last 8760 hours. HbA1C: No results for input(s): HGBA1C in the last 72 hours. CBG: No results for input(s): GLUCAP in the last 168 hours. Lipid Profile: No results for input(s): CHOL, HDL, LDLCALC, TRIG, CHOLHDL, LDLDIRECT in the last 72 hours. Thyroid Function Tests: No results for input(s): TSH, T4TOTAL, FREET4, T3FREE, THYROIDAB in the last 72 hours. Anemia Panel: No results for input(s): VITAMINB12, FOLATE, FERRITIN,  TIBC, IRON, RETICCTPCT in the last 72 hours. Sepsis Labs: No results for input(s): PROCALCITON, LATICACIDVEN in the last 168 hours.  Recent Results (from the past 240 hour(s))  SARS Coronavirus 2 (CEPHEID - Performed in [place] hospital lab), Hosp Order     Status: None   Collection Time: 04/07/19 10:30 AM   Specimen: Nasopharyngeal Swab  Result Value Ref Range Status   SARS Coronavirus 2 NEGATIVE NEGATIVE Final    Comment: (NOTE) If result is NEGATIVE SARS-CoV-2 target nucleic acids are NOT DETECTED. The SARS-CoV-2 RNA is generally detectable in upper and lower  respiratory specimens during the acute phase of infection. The lowest  concentration of SARS-CoV-2 viral copies this assay can detect is 250  copies / mL. A negative result does not preclude SARS-CoV-2 infection  and should not be used as the sole basis for treatment or other  patient management decisions.  A negative result may occur with  improper specimen collection / handling, submission of specimen other  than nasopharyngeal swab, presence of viral mutation(s) within the  areas targeted by this assay, and inadequate number of viral copies  (<250 copies / mL). A negative result must be combined with clinical  observations, patient history, and epidemiological information. If result is POSITIVE SARS-CoV-2 target nucleic acids are DETECTED. The SARS-CoV-2 RNA is generally detectable in upper and lower  respiratory  specimens dur ing the acute phase of infection.  Positive  results are indicative of active infection with SARS-CoV-2.  Clinical  correlation with patient history and other diagnostic information is  necessary to determine patient infection status.  Positive results do  not rule out bacterial infection or co-infection with other viruses. If result is PRESUMPTIVE POSTIVE SARS-CoV-2 nucleic acids MAY BE PRESENT.   A presumptive positive result was obtained on the submitted specimen  and confirmed on repeat  testing.  While 2019 novel coronavirus  (SARS-CoV-2) nucleic acids may be present in the submitted sample  additional confirmatory testing may be necessary for epidemiological  and / or clinical management purposes  to differentiate between  SARS-CoV-2 and other Sarbecovirus currently known to infect humans.  If clinically indicated additional testing with an alternate test  methodology [phone]) is advised. The SARS-CoV-2 RNA is generally  detectable in upper and lower respiratory sp ecimens during the acute  phase of infection. The expected result is Negative. Fact Sheet for Patients:  StrictlyIdeas.no Fact Sheet for Healthcare Providers: BankingDealers.co.za This test is not yet approved or cleared by the [place] FDA and has been authorized for detection and/or diagnosis of SARS-CoV-2 by FDA under an Emergency Use Authorization (EUA).  This EUA will remain in effect (meaning this test can be used) for the duration of the COVID-19 declaration under Section 564(b)(1) of the Act, 21 U.S.C. section 360bbb-3(b)(1), unless the authorization is terminated or revoked sooner. Performed at [facility], [place] [address] [place], [place] [address]          Radiology Studies: Dg Chest 1v Repeat Same Day  Result Date: 04/09/2019 CLINICAL DATA:  63 year old male chest tube placement. Spontaneous left pneumothorax. EXAM: CHEST - 1 VIEW SAME DAY COMPARISON:  0743 hours today and earlier. FINDINGS: Portable AP upright view at 0917 hours. New pigtail type left chest tube in place. Only trace residual pneumothorax is visible along the lateral margin of the descending aorta. Underlying hyperinflation and lung scarring with architectural distortion in the apices greater on the left. No pleural effusion or acute pulmonary opacity. Normal cardiac size and mediastinal contours. Visualized tracheal air column is within normal limits. No acute  osseous abnormality identified. Prior cervical ACDF. Negative visible bowel gas pattern. IMPRESSION: 1. Left chest tube placed with substantially reduced left pneumothorax, trace residual. 2. Underlying chronic lung disease. Electronically Signed   By: Genevie Ann M.D.   On: 04/09/2019 09:58   Dg Chest Port 1 View  Result Date: 04/10/2019 CLINICAL DATA:  Shortness of breath. Chest tube placement for pneumothorax. EXAM: PORTABLE CHEST 1 VIEW COMPARISON:  Radiographs 04/09/2019 and 04/08/2019.  CT 04/06/2019. FINDINGS: 0642 hours. Small caliber pigtail catheter peripherally in the left hemithorax is unchanged in position. The residual left-sided pneumothorax is unchanged with medial basilar and apical components. There is no midline shift. The heart size and mediastinal contours are stable. The lungs are hyperinflated with stable scarring in the left upper lobe. No acute osseous findings are seen. IMPRESSION: Unchanged small left residual pneumothorax following left chest tube placement. No new findings. Electronically Signed   By: Richardean Sale M.D.   On: 04/10/2019 09:33   Dg Chest Port 1 View  Result Date: 04/09/2019 CLINICAL DATA:  History of spontaneous left pneumothorax. EXAM: PORTABLE CHEST 1 VIEW COMPARISON:  Single-view of the chest 04/08/2019. FINDINGS: Left chest tube seen on yesterday's examination is no longer visualized. The tube was essentially outside the chest on yesterday's study. Left pneumothorax  seen on yesterday's examination is increased and is now estimated at 30-40%. The lungs are severely emphysematous with distortion of the pulmonary architecture and scarring in the left upper lobe. Heart size is normal. No right pneumothorax. No pleural effusion. IMPRESSION: Increase in the size of a left pneumothorax now estimated at 30-40%. Severe emphysema. These results were called by telephone at the time of interpretation on 04/09/2019 at 8:07 am to the patient's nurse, Sonia Baller , who verbally  acknowledged these results. Electronically Signed   By: Inge Rise M.D.   On: 04/09/2019 08:09   Dg Chest Port 1 View  Result Date: 04/08/2019 CLINICAL DATA:  Chest tube with air-leak and pneumothorax EXAM: PORTABLE CHEST 1 VIEW COMPARISON:  04/08/2019, CT chest 04/06/2019, PET CT 10/31/2017 FINDINGS: Left-sided chest tube appears withdrawn with the tip positioned over the left lateral lower chest wall soft tissues and side-port external to the patient. Spiculated opacity in the left apex shows no change. Scattered spiculated opacities in the right apex are unchanged. Emphysematous disease with large bulla. Decreased fluid level at the left base. Residual pneumothorax medially and at the left lung base. Stable cardiomediastinal silhouette. Surgical hardware in the cervical spine. IMPRESSION: 1. Left-sided chest tube appears withdrawn, the tip appears to be positioned over the left lateral lower chest wall soft tissues with side port external to the patient. 2. Decreased fluid level at the left lung base. Small left basilar and medial pneumothorax 3. Emphysematous disease with large apical bulla. No change in spiculated opacity in the left upper lobe. These results will be called to the ordering clinician or representative by the Radiologist Assistant, and communication documented in the PACS or zVision Dashboard. Electronically Signed   By: Donavan Foil M.D.   On: 04/08/2019 23:37        Scheduled Meds: . [MAR Hold] enoxaparin (LOVENOX) injection  40 mg Subcutaneous Q24H  . [MAR Hold] feeding supplement (ENSURE ENLIVE)  237 mL Oral Daily  . [MAR Hold] lidocaine  1 patch Transdermal Q24H  . [MAR Hold] megestrol  400 mg Oral Daily  . [MAR Hold] mirtazapine  15 mg Oral QHS   Continuous Infusions: . lactated ringers 10 mL/hr at 04/10/19 1029     LOS: 4 days    Time spent: 30 minutes    Nalleli Largent Darleen Crocker, DO [facility] Pager [phone]  If 7PM-7AM, please contact  night-coverage [email or web address] Password [facility] 04/10/2019, 11:26 AM

## 2019-04-10 NOTE — Interval H&P Note (Signed)
History and Physical Interval Note:  04/10/2019 10:43 AM  Dillon Olson  has presented today for surgery, with the diagnosis of LEFT PTX WITH AIRLEAK.  The various methods of treatment have been discussed with the patient and family. After consideration of risks, benefits and other options for treatment, the patient has consented to  Procedure(s): VIDEO BRONCHOSCOPY WITH INSERTION OF INTERBRONCHIAL VALVE (IBV) (N/A) as a surgical intervention.  The patient's history has been reviewed, patient examined, no change in status, stable for surgery.  I have reviewed the patient's chart and labs.  Questions were answered to the patient's satisfaction.     Melrose Nakayama

## 2019-04-10 NOTE — Op Note (Signed)
NAME: RUTHERFORD, ALARIE MEDICAL RECORD OZ:[id number] ACCOUNT [id number] DATE OF BIRTH:1956/03/11 FACILITY: [facility] LOCATION: [facility] PHYSICIAN:Georgia Delsignore C. Stepheny Canal, MD  OPERATIVE REPORT  DATE OF PROCEDURE:  04/10/2019  PREOPERATIVE DIAGNOSIS:  Spontaneous pneumothorax with ongoing air leak in the setting of severe bullous emphysema.  POSTOPERATIVE DIAGNOSIS:  Spontaneous pneumothorax with ongoing air leak in the setting of severe bullous emphysema.  PROCEDURE:  Bronchoscopy, intrabronchial valve placement x 4.  SURGEON:  Modesto Charon, MD  ASSISTANT:  None.  ANESTHESIA:  General.  FINDINGS:  Significant decrease in air leak with placement of bronchial valves.  CLINICAL NOTE:  The patient is a 63 year old gentleman with a history of tobacco abuse and severe bullous emphysema, particularly involving his left upper lobe.  He presented with a left spontaneous pneumothorax.  A chest tube was placed but he has an  ongoing air leak.  He was offered the option of intrabronchial valve placement to help with resolution of the air leak.  The indications, risks, benefits, and alternatives were discussed in detail with the patient.  He understood and accepted the risks  and agreed to proceed.  OPERATIVE NOTE:  The patient was brought to the operating room on 04/10/2019.  He had induction of general anesthesia and was intubated.  A timeout was performed.  Flexible fiberoptic bronchoscopy was performed via the endotracheal tube.  It revealed  normal endobronchial anatomy with no endobronchial lesions to the level of the subsegmental bronchi.  A Fogarty balloon catheter was advanced through the working port of the scope.  Inflation in the left main stem bronchus resulted in resolution of the air leak.  Occlusion of the left lower lobe bronchus had no effect.  Occlusion of the upper lobe  bronchus decreased, but did not completely eliminate the air leak.  Decision was made to proceed with occluding  the left upper lobe bronchi.  The airways were sized and the appropriate intrabronchial valves were selected.  A 7 mm valve then was placed in  the apical posterior segmental bronchus.  There was a small apical bronchus that was not occluded by the valve and a 5 mm valve was deployed in that airway.  The anterior segmental bronchus then was occluded with a 7 mm valve was well.   Finally, a 9 mm valve was deployed in the lingular bronchus. That valve seated well.  Inspection revealed no notable gaps around any of the valves.  There was a marked decreased, but not total elimination of the air leak after placement of the valves.   The bronchoscope was withdrawn.  The patient was extubated in the operating room and taken to the [facility] in good condition.  TN/NUANCE  D:04/10/2019 T:04/10/2019 JOB:[id number]

## 2019-04-10 NOTE — Anesthesia Preprocedure Evaluation (Signed)
Anesthesia Evaluation  Patient identified by MRN, date of birth, ID band Patient awake    Reviewed: Allergy & Precautions, NPO status , Patient's Chart, lab work & pertinent test results  Airway Mallampati: I  TM Distance: >3 FB Neck ROM: Full    Dental   Pulmonary former smoker,    Pulmonary exam normal        Cardiovascular Normal cardiovascular exam     Neuro/Psych    GI/Hepatic   Endo/Other    Renal/GU      Musculoskeletal   Abdominal   Peds  Hematology   Anesthesia Other Findings   Reproductive/Obstetrics                             Anesthesia Physical Anesthesia Plan  ASA: III  Anesthesia Plan: General   Post-op Pain Management:    Induction: Intravenous  PONV Risk Score and Plan: 2  Airway Management Planned: Oral ETT  Additional Equipment:   Intra-op Plan:   Post-operative Plan: Extubation in OR  Informed Consent: I have reviewed the patients History and Physical, chart, labs and discussed the procedure including the risks, benefits and alternatives for the proposed anesthesia with the patient or authorized representative who has indicated his/her understanding and acceptance.       Plan Discussed with: CRNA and Surgeon  Anesthesia Plan Comments:         Anesthesia Quick Evaluation

## 2019-04-10 NOTE — Anesthesia Postprocedure Evaluation (Signed)
Anesthesia Post Note  Patient: Dillon Olson  Procedure(s) Performed: VIDEO BRONCHOSCOPY WITH INSERTION OF INTERBRONCHIAL VALVE (IBV) (N/A )     Patient location during evaluation: PACU Anesthesia Type: General Level of consciousness: awake and alert Pain management: pain level controlled Vital Signs Assessment: post-procedure vital signs reviewed and stable Respiratory status: spontaneous breathing, nonlabored ventilation, respiratory function stable and patient connected to nasal cannula oxygen Cardiovascular status: blood pressure returned to baseline and stable Postop Assessment: no apparent nausea or vomiting Anesthetic complications: no    Last Vitals:  Vitals:   04/10/19 1342 04/10/19 1345  BP: 108/72   Pulse: 71 72  Resp: 12 12  Temp:    SpO2: 97% 96%    Last Pain:  Vitals:   04/10/19 1300  TempSrc:   PainSc: 0-No pain                 Providence Stivers DAVID

## 2019-04-10 NOTE — Transfer of Care (Signed)
Immediate Anesthesia Transfer of Care Note  Patient: Dillon Olson  Procedure(s) Performed: VIDEO BRONCHOSCOPY WITH INSERTION OF INTERBRONCHIAL VALVE (IBV) (N/A )  Patient Location: PACU  Anesthesia Type:General  Level of Consciousness: awake, alert  and oriented  Airway & Oxygen Therapy: Patient Spontanous Breathing  Post-op Assessment: Report given to RN and Post -op Vital signs reviewed and stable  Post vital signs: Reviewed and stable  Last Vitals:  Vitals Value Taken Time  BP    Temp    Pulse    Resp    SpO2      Last Pain:  Vitals:   04/10/19 0816  TempSrc: Oral  PainSc: 2       Patients Stated Pain Goal: 0 (38/45/36 4680)  Complications: No apparent anesthesia complications

## 2019-04-10 NOTE — Brief Op Note (Signed)
04/10/2019  12:24 PM  PATIENT:  Dillon Olson  63 y.o. male  PRE-OPERATIVE DIAGNOSIS:   left spontaneous pneumothorax with ongoing airleak  POST-OPERATIVE DIAGNOSIS:  left spontaneous pneumothorax with ongoing airleak  PROCEDURE:  Procedure(s): VIDEO BRONCHOSCOPY WITH INSERTION OF INTERBRONCHIAL VALVE (IBV) (N/A) x 4  SURGEON:  Surgeon(s) and Role:    * Melrose Nakayama, MD - Primary  PHYSICIAN ASSISTANT:   ASSISTANTS: none   ANESTHESIA:   general  EBL:  2 mL   BLOOD ADMINISTERED:none  DRAINS: none   LOCAL MEDICATIONS USED:  NONE  SPECIMEN:  No Specimen  DISPOSITION OF SPECIMEN:  N/A  COUNTS:  NO endo  TOURNIQUET:  * No tourniquets in log *  DICTATION: .Other Dictation: Dictation Number -  PLAN OF CARE: Admit to inpatient   PATIENT DISPOSITION:  PACU - hemodynamically stable.   Delay start of Pharmacological VTE agent (>24hrs) due to surgical blood loss or risk of bleeding: no

## 2019-04-10 NOTE — Progress Notes (Signed)
Initial Nutrition Assessment  DOCUMENTATION CODES:   Not applicable  INTERVENTION:    Obtain admission weight  Ensure Enlive po TID, each supplement provides 350 kcal and 20 grams of protein  NUTRITION DIAGNOSIS:   Increased nutrient needs related to post-op healing as evidenced by estimated needs.  GOAL:   Patient will meet greater than or equal to 90% of their needs  MONITOR:   PO intake, Supplement acceptance, Weight trends, Labs, I & O's  REASON FOR ASSESSMENT:   Consult Assessment of nutrition requirement/status  ASSESSMENT:   Patient with PMH significant for L left lung cancer s/p radiation. Recently admitted at [facility] with L spontaneous pneumothorax s/p chest tube. Transferred to [facility] for further evaluation.   7/28- chest tube replaced  RD working remotely.  Pt currently in PACU s/p bronchoscopy with insertion of IBV. Unable to obtain history. Meal completions charted as 50-100% for pt's last four meals. RD to provide supplementation to maximize kcal and protein.   Need recent admission weight. Pt weighed 42.8 kg at [facility] on 7/25.Suspect pt has lost significant amount. Unable to diagnosis malnutrition without dietary recall or NFPE.   I/O: -522 ml since admit UOP: 775 ml x 24 hrs  Chest tube: 50 ml x 24 hrs   Medications: megace, remeron Labs:reviewed  Diet Order:   Diet Order            Diet NPO time specified Except for: Sips with Meds  Diet effective midnight              EDUCATION NEEDS:   Not appropriate for education at this time  Skin:  Skin Assessment: Skin Integrity Issues: Skin Integrity Issues:: Incisions Incisions: L chest tube  Last BM:  7/28  Height:   Ht Readings from Last 1 Encounters:  11/05/18 5\' 5"  (1.651 m)    Weight:   Wt Readings from Last 1 Encounters:  11/05/18 51.4 kg    Ideal Body Weight:  61.8 kg  BMI:  There is no height or weight on file to calculate BMI.  Estimated Nutritional  Needs:   Kcal:  1800-2000 kcal  Protein:  90-105 grams  Fluid:  >/= 1.8 L/day   Mariana Single RD, LDN Clinical Nutrition Pager # - [phone]

## 2019-04-10 NOTE — Anesthesia Procedure Notes (Signed)
Procedure Name: Intubation Date/Time: 04/10/2019 11:42 AM Performed by: Marsa Aris, CRNA Pre-anesthesia Checklist: Patient identified, Emergency Drugs available, Suction available and Patient being monitored Patient Re-evaluated:Patient Re-evaluated prior to induction Oxygen Delivery Method: Circle System Utilized Preoxygenation: Pre-oxygenation with 100% oxygen Induction Type: IV induction Ventilation: Mask ventilation without difficulty Laryngoscope Size: Miller and 2 Grade View: Grade I Tube type: Oral Tube size: 9.0 mm Number of attempts: 1 Airway Equipment and Method: Stylet and Oral airway Placement Confirmation: ETT inserted through vocal cords under direct vision,  positive ETCO2 and breath sounds checked- equal and bilateral Secured at: 22 cm Tube secured with: Tape Dental Injury: Teeth and Oropharynx as per pre-operative assessment

## 2019-04-11 ENCOUNTER — Inpatient Hospital Stay (HOSPITAL_COMMUNITY): Payer: No Typology Code available for payment source

## 2019-04-11 ENCOUNTER — Encounter (HOSPITAL_COMMUNITY): Payer: Self-pay | Admitting: Thoracic Surgery (Cardiothoracic Vascular Surgery)

## 2019-04-11 LAB — BASIC METABOLIC PANEL
Anion gap: 8 (ref 5–15)
BUN: 11 mg/dL (ref 8–23)
CO2: 23 mmol/L (ref 22–32)
Calcium: 9 mg/dL (ref 8.9–10.3)
Chloride: 107 mmol/L (ref 98–111)
Creatinine, Ser: 0.79 mg/dL (ref 0.61–1.24)
GFR calc Af Amer: >60 mL/min (ref >60)
GFR calc non Af Amer: >60 mL/min (ref >60)
Glucose, Bld: 105 mg/dL — ABNORMAL HIGH (ref 70–99)
Potassium: 3.9 mmol/L (ref 3.5–5.1)
Sodium: 138 mmol/L (ref 135–145)

## 2019-04-11 MED ORDER — GENERIC EXTERNAL MEDICATION
Status: DC
Start: ? — End: 2019-04-11

## 2019-04-11 NOTE — Progress Notes (Addendum)
      [address]       [place] [address]             [phone]       1 Day Post-Op Procedure(s) (LRB): VIDEO BRONCHOSCOPY WITH INSERTION OF INTERBRONCHIAL VALVE (IBV) (N/A)  Subjective: Patient without complaints this am.  Objective: Vital signs in last 24 hours: Temp:  [97.2 F (36.2 C)-98 F (36.7 C)] 98 F (36.7 C) (07/30 0430) Pulse Rate:  [66-105] 66 (07/30 0430) Cardiac Rhythm: Normal sinus rhythm (07/30 0700) Resp:  [11-22] 12 (07/30 0430) BP: (108-155)/(69-90) 114/75 (07/30 0430) SpO2:  [95 %-100 %] 97 % (07/30 0430)     Intake/Output from previous day: 07/29 0701 - 07/30 0700 In: 650 [I.V.:650] Out: 574 [Urine:550; Blood:2; Chest Tube:22]   Physical Exam:  Cardiovascular: RRR Pulmonary: Clear to auscultation on right and slightly diminished on the left Extremities: No lower extremity edema. Wounds: Clean and dry.  No erythema or signs of infection. Chest Tube: to suction, +1 air leak  Lab Results: CBC: Recent Labs    04/10/19 0358  WBC 8.1  HGB 13.3  HCT 39.4  PLT 276   BMET:  Recent Labs    04/10/19 0358 04/11/19 0300  NA 137 138  K 4.2 3.9  CL 106 107  CO2 24 23  GLUCOSE 103* 105*  BUN 10 11  CREATININE 0.85 0.79  CALCIUM 9.3 9.0    PT/INR: No results for input(s): LABPROT, INR in the last 72 hours. ABG:  INR: Will add last result for INR, ABG once components are confirmed Will add last 4 CBG results once components are confirmed  Assessment/Plan:  1. CV - SR in the 80's. 2.  Pulmonary - S/p intra bronchial valves x 4 for spontaneous pneumothorax and persistent air leak. On room air. Chest tube is to suction and there is a +1 air leak that does not worsen with cough.  CXR this am appears stable. Will continue chest tube to suction for now. Check CXR in am.   Donielle M ZimmermanPA-C 04/11/2019,7:43 AM [phone]  Patient seen and examined, agree with above CXR looks good this AM Still has a leak but  much smaller after valve placement Will keep on suction today and try water seal tomorrow  Remo Lipps C. Roxan Hockey, MD [facility] [phone]

## 2019-04-11 NOTE — Progress Notes (Signed)
PROGRESS NOTE  Dillon Olson UXN:[id number] DOB: 05/08/1956 DOA: 04/06/2019 PCP: System, Pcp Not In  HPI/Recap of past 69 hours:  63 year old male reformed smoker with past medical history relevant for presumed left lung cancer not amenable to biopsy that was empirically treated with radiation therapy- previously undergone SBRT for presumed Stage 1A NSCLC in '17 (no biopsy). Admitted to [facility] in [place] on 04/01/2019 with pneumothorax, status post chest tube placement at outside facility. Transferred from [facility] in [place] to [place] on 04/06/2019 with persistent air leak from the left chest tube requiring CT surgery consult  --presumed left upper lobe non-small cell lung cancer- s/p Prior Ling Radiation -Now being treated for spontaneous pneumothorax- with Lt sided Chest tube with persistent leak- CT surgery to decide on VATS Versus Endobronchial valves.  7/28:Patient noted to have increased chest tube airleak overnight without any respiratory distress. CT surgery had noted that chest tube was dislodged and has replaced itthis morning.  7/29: Plans for patient undergo video bronchoscopy with insertion of intrabronchial valve x4 today per CT surgery. Completed.  04/11/19: Patient was seen and examined at his bedside this morning.  He denies chest pain at this time after taking his pain medication.  Denies dyspnea at rest. Chest tube in place.  Per cardiothoracic surgery still has a leak but much smaller after valve placement.   Assessment/Plan: Principal Problem:   Pneumothorax on left Active Problems:   Solitary pulmonary nodule/presumed left lung cancer-status post prior radiation therapy   COPD with emphysema/Bleps   FTT (failure to thrive) in adult/left lung cancer related    Spontaneous pneumothorax post bronchoscopy with intrabronchial valve placement x4 by CTS on 04/10/19 Per CTS still has a leak but much smaller after valve replacement Keep  on suction today and water seal tomorrow per CTS Minimal pain, well controlled on pain meds Monitor O2 sat and maintain above 92% Reviewed CX done today personally no significant change noted. Lungs clear.  Non small cell left lung cancer post radiation Follow up outpatient at [facility] and [facility] at [place]  FTT likely 2/2 to malignancy C/w megace and oral supplements  COPD/emphysema w blebs C/w bronchodilators Maintain O2 sat >92%   DVT prophylaxis:Lovenox Code Status:Full Family Communication:None at bedside Disposition Plan:Per CT Surgery   Consultants:  CT surgery  Procedures:  CT placement  Replacement 7/28  Video bronchoscopy with insertion of intrabronchial valve 04/10/19     Objective: Vitals:   04/10/19 1947 04/10/19 2341 04/11/19 0430 04/11/19 0942  BP: (!) 144/90 (!) 155/89 114/75 (!) 143/81  Pulse: 98 (!) 105 66 97  Resp: 16 18 12 15   Temp: 97.9 F (36.6 C) 97.8 F (36.6 C) 98 F (36.7 C) 97.9 F (36.6 C)  TempSrc:   Oral Oral  SpO2: 97% 97% 97% 97%    Intake/Output Summary (Last 24 hours) at 04/11/2019 1052 Last data filed at 04/11/2019 0951 Gross per 24 hour  Intake 850 ml  Output 774 ml  Net 76 ml   There were no vitals filed for this visit.  Exam:  . General: 63 y.o. year-old male Emaciated in no acute distress.  Alert and oriented x3. . Cardiovascular: Regular rate and rhythm with no rubs or gallops.  No thyromegaly or JVD noted.   Marland Kitchen Respiratory: Clear to auscultation with no wheezes or rales. Good inspiratory effort. Left chest tube in place to suction. Dressing in place. . Abdomen: Soft nontender nondistended with normal bowel sounds x4 quadrants. . Musculoskeletal: No  lower extremity edema. 2/4 pulses in all 4 extremities. Marland Kitchen Psychiatry: Mood is appropriate for condition and setting   Data Reviewed: CBC: Recent Labs  Lab 04/06/19 1951 04/07/19 0946 04/10/19 0358  WBC 11.7* 8.5 8.1  NEUTROABS 7.6  --   --   HGB 15.0  14.9 13.3  HCT 43.5 43.1 39.4  MCV 93.8 94.1 94.9  PLT 302 275 622   Basic Metabolic Panel: Recent Labs  Lab 04/06/19 1951 04/07/19 0946 04/10/19 0358 04/11/19 0300  NA 135 138 137 138  K 3.9 4.6 4.2 3.9  CL 99 102 106 107  CO2 24 25 24 23   GLUCOSE 106* 95 103* 105*  BUN 13 13 10 11   CREATININE 0.87 0.97 0.85 0.79  CALCIUM 9.4 9.7 9.3 9.0  MG 1.9  --   --   --   PHOS 3.7  --   --   --    GFR: CrCl cannot be calculated (Unknown ideal weight.). Liver Function Tests: Recent Labs  Lab 04/06/19 1951  AST 18  ALT 15  ALKPHOS 95  BILITOT 0.4  PROT 6.9  ALBUMIN 3.6   No results for input(s): LIPASE, AMYLASE in the last 168 hours. No results for input(s): AMMONIA in the last 168 hours. Coagulation Profile: No results for input(s): INR, PROTIME in the last 168 hours. Cardiac Enzymes: No results for input(s): CKTOTAL, CKMB, CKMBINDEX, TROPONINI in the last 168 hours. BNP (last 3 results) No results for input(s): PROBNP in the last 8760 hours. HbA1C: No results for input(s): HGBA1C in the last 72 hours. CBG: No results for input(s): GLUCAP in the last 168 hours. Lipid Profile: No results for input(s): CHOL, HDL, LDLCALC, TRIG, CHOLHDL, LDLDIRECT in the last 72 hours. Thyroid Function Tests: No results for input(s): TSH, T4TOTAL, FREET4, T3FREE, THYROIDAB in the last 72 hours. Anemia Panel: No results for input(s): VITAMINB12, FOLATE, FERRITIN, TIBC, IRON, RETICCTPCT in the last 72 hours. Urine analysis:    Component Value Date/Time   COLORURINE YELLOW 04/06/2019 2020   APPEARANCEUR CLEAR 04/06/2019 2020   LABSPEC 1.015 04/06/2019 2020   PHURINE 6.0 04/06/2019 2020   GLUCOSEU NEGATIVE 04/06/2019 2020   HGBUR NEGATIVE 04/06/2019 2020   BILIRUBINUR NEGATIVE 04/06/2019 2020   KETONESUR NEGATIVE 04/06/2019 2020   PROTEINUR NEGATIVE 04/06/2019 2020   NITRITE NEGATIVE 04/06/2019 2020   LEUKOCYTESUR NEGATIVE 04/06/2019 2020   Sepsis Labs:  @LABRCNTIP (procalcitonin:4,lacticidven:4)  ) Recent Results (from the past 240 hour(s))  SARS Coronavirus 2 (CEPHEID - Performed in [place] hospital lab), Hosp Order     Status: None   Collection Time: 04/07/19 10:30 AM   Specimen: Nasopharyngeal Swab  Result Value Ref Range Status   SARS Coronavirus 2 NEGATIVE NEGATIVE Final    Comment: (NOTE) If result is NEGATIVE SARS-CoV-2 target nucleic acids are NOT DETECTED. The SARS-CoV-2 RNA is generally detectable in upper and lower  respiratory specimens during the acute phase of infection. The lowest  concentration of SARS-CoV-2 viral copies this assay can detect is 250  copies / mL. A negative result does not preclude SARS-CoV-2 infection  and should not be used as the sole basis for treatment or other  patient management decisions.  A negative result may occur with  improper specimen collection / handling, submission of specimen other  than nasopharyngeal swab, presence of viral mutation(s) within the  areas targeted by this assay, and inadequate number of viral copies  (<250 copies / mL). A negative result must be combined with clinical  observations,  patient history, and epidemiological information. If result is POSITIVE SARS-CoV-2 target nucleic acids are DETECTED. The SARS-CoV-2 RNA is generally detectable in upper and lower  respiratory specimens dur ing the acute phase of infection.  Positive  results are indicative of active infection with SARS-CoV-2.  Clinical  correlation with patient history and other diagnostic information is  necessary to determine patient infection status.  Positive results do  not rule out bacterial infection or co-infection with other viruses. If result is PRESUMPTIVE POSTIVE SARS-CoV-2 nucleic acids MAY BE PRESENT.   A presumptive positive result was obtained on the submitted specimen  and confirmed on repeat testing.  While 2019 novel coronavirus  (SARS-CoV-2) nucleic acids may be present in the  submitted sample  additional confirmatory testing may be necessary for epidemiological  and / or clinical management purposes  to differentiate between  SARS-CoV-2 and other Sarbecovirus currently known to infect humans.  If clinically indicated additional testing with an alternate test  methodology [phone]) is advised. The SARS-CoV-2 RNA is generally  detectable in upper and lower respiratory sp ecimens during the acute  phase of infection. The expected result is Negative. Fact Sheet for Patients:  StrictlyIdeas.no Fact Sheet for Healthcare Providers: BankingDealers.co.za This test is not yet approved or cleared by the [place] FDA and has been authorized for detection and/or diagnosis of SARS-CoV-2 by FDA under an Emergency Use Authorization (EUA).  This EUA will remain in effect (meaning this test can be used) for the duration of the COVID-19 declaration under Section 564(b)(1) of the Act, 21 U.S.C. section 360bbb-3(b)(1), unless the authorization is terminated or revoked sooner. Performed at [facility], [place] [address] [place], [place] [address]       Studies: Dg Chest Port 1 View  Result Date: 04/11/2019 CLINICAL DATA:  Chest tube EXAM: PORTABLE CHEST 1 VIEW COMPARISON:  Yesterday FINDINGS: Left lateral small bore chest tube in stable position. Endobronchial valves on the left. Small left apical and basal pneumothorax without convincing change. Emphysema. Normal heart size and mediastinal contours. IMPRESSION: New endobronchial valves. No convincing change in the small left pneumothorax. Electronically Signed   By: Monte Fantasia M.D.   On: 04/11/2019 08:17    Scheduled Meds: . enoxaparin (LOVENOX) injection  40 mg Subcutaneous Q24H  . feeding supplement (ENSURE ENLIVE)  237 mL Oral TID BM  . lidocaine  1 patch Transdermal Q24H  . megestrol  400 mg Oral Daily  . mirtazapine  15 mg Oral QHS    Continuous  Infusions: . lactated ringers Stopped (04/10/19 1224)     LOS: 5 days     Kayleen Memos, MD [facility] Pager [phone]  If 7PM-7AM, please contact night-coverage [email or web address] Password [email or web address] 04/11/2019, 10:52 AM

## 2019-04-12 ENCOUNTER — Inpatient Hospital Stay (HOSPITAL_COMMUNITY): Payer: No Typology Code available for payment source

## 2019-04-12 DIAGNOSIS — J9311 Primary spontaneous pneumothorax: Secondary | ICD-10-CM

## 2019-04-12 MED ORDER — ACYCLOVIR 5 % EX OINT
TOPICAL_OINTMENT | CUTANEOUS | Status: AC
  Administered 2019-04-12 – 2019-04-13 (×2): via TOPICAL
  Administered 2019-04-13: 1 via TOPICAL
  Administered 2019-04-13 – 2019-04-15 (×12): via TOPICAL
  Administered 2019-04-15: 1 via TOPICAL
  Administered 2019-04-15 (×2): via TOPICAL
  Administered 2019-04-15: 1 via TOPICAL
  Administered 2019-04-16 (×2): via TOPICAL
  Administered 2019-04-16: 1 via TOPICAL
  Administered 2019-04-16 (×2): via TOPICAL
  Filled 2019-04-12: qty 15

## 2019-04-12 MED ORDER — GENERIC EXTERNAL MEDICATION
Status: DC
Start: ? — End: 2019-04-12

## 2019-04-12 MED ORDER — ACYCLOVIR 5 % EX CREA
TOPICAL_CREAM | CUTANEOUS | Status: DC
  Filled 2019-04-12: qty 5

## 2019-04-12 NOTE — Progress Notes (Signed)
PROGRESS NOTE  Dillon Olson WHQ:[id number] DOB: 1956-07-29 DOA: 04/06/2019 PCP: System, Pcp Not In  HPI/Recap of past 92 hours:  63 year old male reformed smoker with past medical history relevant for presumed left lung cancer not amenable to biopsy that was empirically treated with radiation therapy- previously undergone SBRT for presumed Stage 1A NSCLC in '17 (no biopsy). Admitted to [facility] in [place] on 04/01/2019 with pneumothorax, status post chest tube placement at outside facility. Transferred from [facility] in [place] to [place] on 04/06/2019 with persistent air leak from the left chest tube requiring CT surgery consult  --presumed left upper lobe non-small cell lung cancer- s/p Prior Ling Radiation -Now being treated for spontaneous pneumothorax- with Lt sided Chest tube with persistent leak- CT surgery to decide on VATS Versus Endobronchial valves.  7/28:Patient noted to have increased chest tube airleak overnight without any respiratory distress. CT surgery had noted that chest tube was dislodged and has replaced itthis morning.  7/29: Plans for patient undergo video bronchoscopy with insertion of intrabronchial valve x4 today per CT surgery. Completed.  04/11/19: Patient was seen and examined at his bedside this morning.  He denies chest pain at this time after taking his pain medication.  Denies dyspnea at rest. Chest tube in place.  Per cardiothoracic surgery still has a leak but much smaller after valve placement.  04/12/19: Patient was seen and examined at his bedside this morning.  No acute events overnight.  He denies chest pain, palpitations or dyspnea.  He has no new complaints.  O2 saturation 96% on room air.  Vital signs reviewed and are stable.  Left chest tube to suction.   Assessment/Plan: Principal Problem:   Pneumothorax on left Active Problems:   Solitary pulmonary nodule/presumed left lung cancer-status post prior radiation  therapy   COPD with emphysema/Bleps   FTT (failure to thrive) in adult/left lung cancer related    Spontaneous pneumothorax post bronchoscopy with intrabronchial valve placement x4 by CTS on 04/10/19 Minimal pain well-controlled on pain medications. States pain is present only when he moves. Monitor O2 sat and maintain above 92% Serial chest x-ray per cardiothoracic surgery  Non small cell left lung cancer post radiation Follow up outpatient at [facility]  FTT likely 2/2 to malignancy C/w megace and oral supplements Labs and vital signs reviewed and are stable.  COPD/emphysema w blebs C/w bronchodilators Maintain O2 sat >92%   DVT prophylaxis:Lovenox subcu daily Code Status:Full Family Communication:None at bedside Disposition Plan:Per CT Surgery   Consultants:  CT surgery  Procedures:  CT placement  Replacement 7/28  Video bronchoscopy with insertion of intrabronchial valve 04/10/19     Objective: Vitals:   04/11/19 1654 04/11/19 1915 04/12/19 0330 04/12/19 0805  BP: 126/80 121/82 122/82 138/89  Pulse: (!) 101 89 87 91  Resp: 14 13 17 17   Temp: 97.9 F (36.6 C) (!) 97 F (36.1 C) 98 F (36.7 C) 97.8 F (36.6 C)  TempSrc: Oral Oral Oral Oral  SpO2: 96% 96% 97% 96%    Intake/Output Summary (Last 24 hours) at 04/12/2019 1455 Last data filed at 04/12/2019 1315 Gross per 24 hour  Intake 680 ml  Output 1158 ml  Net -478 ml   There were no vitals filed for this visit.  Exam:  . General: 63 y.o. year-old male Emaciated.in no acute distress.  Alert and oriented x3. . Cardiovascular: Regular rate and rhythm no rubs or gallops no JVD or thyromegaly. Marland Kitchen Respiratory: Clear to  auscultation no wheezes or rales.  Poor inspiratory effort.  Left chest tube in place. . Abdomen: Soft nontender nondistended normal bowel sounds present.  . Musculoskeletal: No lower extremity edema.  Peripheral pulses in all 4 extremities. Marland Kitchen Psychiatry: Mood  is appropriate for condition and setting.  Data Reviewed: CBC: Recent Labs  Lab 04/06/19 1951 04/07/19 0946 04/10/19 0358  WBC 11.7* 8.5 8.1  NEUTROABS 7.6  --   --   HGB 15.0 14.9 13.3  HCT 43.5 43.1 39.4  MCV 93.8 94.1 94.9  PLT 302 275 854   Basic Metabolic Panel: Recent Labs  Lab 04/06/19 1951 04/07/19 0946 04/10/19 0358 04/11/19 0300  NA 135 138 137 138  K 3.9 4.6 4.2 3.9  CL 99 102 106 107  CO2 24 25 24 23   GLUCOSE 106* 95 103* 105*  BUN 13 13 10 11   CREATININE 0.87 0.97 0.85 0.79  CALCIUM 9.4 9.7 9.3 9.0  MG 1.9  --   --   --   PHOS 3.7  --   --   --    GFR: CrCl cannot be calculated (Unknown ideal weight.). Liver Function Tests: Recent Labs  Lab 04/06/19 1951  AST 18  ALT 15  ALKPHOS 95  BILITOT 0.4  PROT 6.9  ALBUMIN 3.6   No results for input(s): LIPASE, AMYLASE in the last 168 hours. No results for input(s): AMMONIA in the last 168 hours. Coagulation Profile: No results for input(s): INR, PROTIME in the last 168 hours. Cardiac Enzymes: No results for input(s): CKTOTAL, CKMB, CKMBINDEX, TROPONINI in the last 168 hours. BNP (last 3 results) No results for input(s): PROBNP in the last 8760 hours. HbA1C: No results for input(s): HGBA1C in the last 72 hours. CBG: No results for input(s): GLUCAP in the last 168 hours. Lipid Profile: No results for input(s): CHOL, HDL, LDLCALC, TRIG, CHOLHDL, LDLDIRECT in the last 72 hours. Thyroid Function Tests: No results for input(s): TSH, T4TOTAL, FREET4, T3FREE, THYROIDAB in the last 72 hours. Anemia Panel: No results for input(s): VITAMINB12, FOLATE, FERRITIN, TIBC, IRON, RETICCTPCT in the last 72 hours. Urine analysis:    Component Value Date/Time   COLORURINE YELLOW 04/06/2019 2020   APPEARANCEUR CLEAR 04/06/2019 2020   LABSPEC 1.015 04/06/2019 2020   PHURINE 6.0 04/06/2019 2020   GLUCOSEU NEGATIVE 04/06/2019 2020   HGBUR NEGATIVE 04/06/2019 2020   BILIRUBINUR NEGATIVE 04/06/2019 2020   KETONESUR  NEGATIVE 04/06/2019 2020   PROTEINUR NEGATIVE 04/06/2019 2020   NITRITE NEGATIVE 04/06/2019 2020   LEUKOCYTESUR NEGATIVE 04/06/2019 2020   Sepsis Labs: @LABRCNTIP (procalcitonin:4,lacticidven:4)  ) Recent Results (from the past 240 hour(s))  SARS Coronavirus 2 (CEPHEID - Performed in [place] hospital lab), Hosp Order     Status: None   Collection Time: 04/07/19 10:30 AM   Specimen: Nasopharyngeal Swab  Result Value Ref Range Status   SARS Coronavirus 2 NEGATIVE NEGATIVE Final    Comment: (NOTE) If result is NEGATIVE SARS-CoV-2 target nucleic acids are NOT DETECTED. The SARS-CoV-2 RNA is generally detectable in upper and lower  respiratory specimens during the acute phase of infection. The lowest  concentration of SARS-CoV-2 viral copies this assay can detect is 250  copies / mL. A negative result does not preclude SARS-CoV-2 infection  and should not be used as the sole basis for treatment or other  patient management decisions.  A negative result may occur with  improper specimen collection / handling, submission of specimen other  than nasopharyngeal swab, presence of viral mutation(s) within  the  areas targeted by this assay, and inadequate number of viral copies  (<250 copies / mL). A negative result must be combined with clinical  observations, patient history, and epidemiological information. If result is POSITIVE SARS-CoV-2 target nucleic acids are DETECTED. The SARS-CoV-2 RNA is generally detectable in upper and lower  respiratory specimens dur ing the acute phase of infection.  Positive  results are indicative of active infection with SARS-CoV-2.  Clinical  correlation with patient history and other diagnostic information is  necessary to determine patient infection status.  Positive results do  not rule out bacterial infection or co-infection with other viruses. If result is PRESUMPTIVE POSTIVE SARS-CoV-2 nucleic acids MAY BE PRESENT.   A presumptive positive  result was obtained on the submitted specimen  and confirmed on repeat testing.  While 2019 novel coronavirus  (SARS-CoV-2) nucleic acids may be present in the submitted sample  additional confirmatory testing may be necessary for epidemiological  and / or clinical management purposes  to differentiate between  SARS-CoV-2 and other Sarbecovirus currently known to infect humans.  If clinically indicated additional testing with an alternate test  methodology [phone]) is advised. The SARS-CoV-2 RNA is generally  detectable in upper and lower respiratory sp ecimens during the acute  phase of infection. The expected result is Negative. Fact Sheet for Patients:  StrictlyIdeas.no Fact Sheet for Healthcare Providers: BankingDealers.co.za This test is not yet approved or cleared by the [place] FDA and has been authorized for detection and/or diagnosis of SARS-CoV-2 by FDA under an Emergency Use Authorization (EUA).  This EUA will remain in effect (meaning this test can be used) for the duration of the COVID-19 declaration under Section 564(b)(1) of the Act, 21 U.S.C. section 360bbb-3(b)(1), unless the authorization is terminated or revoked sooner. Performed at [facility], [place] [address], [place], [place] [address]       Studies: Dg Chest [place] 1 View  Result Date: 04/12/2019 CLINICAL DATA:  63 year old male with left-sided chest tube in place EXAM: PORTABLE CHEST 1 VIEW COMPARISON:  Prior chest x-ray 04/11/2019 FINDINGS: Stable position of left-sided chest tube. Endobronchial valves remain in stable position. Stable to perhaps slightly improved left apical pneumothorax. Similar degree of biapical pleuroparenchymal scarring and bullous emphysema. Incompletely imaged surgical changes of prior anterior cervicothoracic fusion. No acute osseous abnormality. IMPRESSION: 1. Stable to perhaps slightly improved left apical pneumothorax. 2.  Left-sided chest tube remains in unchanged position. Electronically Signed   By: Jacqulynn Cadet M.D.   On: 04/12/2019 08:55    Scheduled Meds: . enoxaparin (LOVENOX) injection  40 mg Subcutaneous Q24H  . feeding supplement (ENSURE ENLIVE)  237 mL Oral TID BM  . lidocaine  1 patch Transdermal Q24H  . megestrol  400 mg Oral Daily  . mirtazapine  15 mg Oral QHS    Continuous Infusions: . lactated ringers Stopped (04/10/19 1224)     LOS: 6 days     Kayleen Memos, MD [facility] Pager [phone]  If 7PM-7AM, please contact night-coverage [email or web address] Password [facility] 04/12/2019, 2:55 PM

## 2019-04-12 NOTE — Progress Notes (Signed)
Pt has been despondent today, affect has been withdrawn and irritable.  He refuses to ambulate, states he is depressed.  He was upset that his chest tube did not get removed from suction this morning.  I believe he thought he was closer to discharge.  I encouraged pt to rethink walking this evening, as it would help him progress toward discharge.  At this time he states "maybe tomorrow".

## 2019-04-12 NOTE — Progress Notes (Addendum)
      [address]       [place] [address]             [phone]       2 Days Post-Op Procedure(s) (LRB): VIDEO BRONCHOSCOPY WITH INSERTION OF INTERBRONCHIAL VALVE (IBV) (N/A)  Subjective: Patient without complaints this am.  Objective: Vital signs in last 24 hours: Temp:  [97 F (36.1 C)-98 F (36.7 C)] 98 F (36.7 C) (07/31 0330) Pulse Rate:  [87-101] 87 (07/31 0330) Cardiac Rhythm: Normal sinus rhythm (07/31 0330) Resp:  [13-17] 17 (07/31 0330) BP: (121-143)/(80-82) 122/82 (07/31 0330) SpO2:  [96 %-97 %] 97 % (07/31 0330)     Intake/Output from previous day: 07/30 0701 - 07/31 0700 In: 680 [P.O.:680] Out: 1533 [Urine:1525; Chest Tube:8]   Physical Exam:  Cardiovascular: RRR Pulmonary: Clear to auscultation on right and audible squeak on the left with inspiration Extremities: No lower extremity edema. Wounds: Clean and dry.  No erythema or signs of infection. Chest Tube: to suction, +1 air leak  Lab Results: CBC: Recent Labs    04/10/19 0358  WBC 8.1  HGB 13.3  HCT 39.4  PLT 276   BMET:  Recent Labs    04/10/19 0358 04/11/19 0300  NA 137 138  K 4.2 3.9  CL 106 107  CO2 24 23  GLUCOSE 103* 105*  BUN 10 11  CREATININE 0.85 0.79  CALCIUM 9.3 9.0    PT/INR: No results for input(s): LABPROT, INR in the last 72 hours. ABG:  INR: Will add last result for INR, ABG once components are confirmed Will add last 4 CBG results once components are confirmed  Assessment/Plan:  1. CV - SR in the 80's. 2.  Pulmonary - S/p intra bronchial valves x 4 for spontaneous pneumothorax and persistent air leak. On room air. Chest tube is to suction and there continues to be a +1 air leak that does not worsen with cough.  CXR this am appears stable. Will continue chest tube to suction for now. Check CXR in am.   Donielle M ZimmermanPA-C 04/12/2019,7:59 AM [phone]  Pt seen and examined; films reviewed. Agree with assessment and plan.  Leave tube to suction for now.

## 2019-04-13 ENCOUNTER — Inpatient Hospital Stay (HOSPITAL_COMMUNITY): Payer: No Typology Code available for payment source

## 2019-04-13 LAB — CREATININE, SERUM
Creatinine, Ser: 0.76 mg/dL (ref 0.61–1.24)
GFR calc Af Amer: >60 mL/min (ref >60)
GFR calc non Af Amer: >60 mL/min (ref >60)

## 2019-04-13 MED ORDER — GENERIC EXTERNAL MEDICATION
Status: DC
Start: ? — End: 2019-04-13

## 2019-04-13 MED ORDER — SENNOSIDES-DOCUSATE SODIUM 8.6-50 MG PO TABS
2.0000 | ORAL_TABLET | Freq: Two times a day (BID) | ORAL | Status: DC
  Administered 2019-04-13 – 2019-04-21 (×7): 2 via ORAL
  Filled 2019-04-13 (×13): qty 2

## 2019-04-13 NOTE — Evaluation (Signed)
Physical Therapy Evaluation Patient Details Name: Dillon Olson MRN: [id number] DOB: 1956-01-12 Today's Date: 04/13/2019   History of Present Illness  63 year old male reformed smoker with past medical history relevant for presumed left lung cancer not amenable to biopsy that was empirically treated with radiation therapy- previously undergone SBRT for presumed Stage 1A NSCLC in '17 (no biopsy).  Admitted to [facility] in [place] on 04/01/2019 with pneumothorax, status post chest tube placement at outside facility. Transferred from [facility] in [place] to [facility]  on 04/06/2019 with persistent air leak from the left chest tube requiring CT surgery consult.  Clinical Impression  Patient evaluated by Physical Therapy with no further acute PT needs identified. All education has been completed and the patient has no further questions. Demonstrates safe gait and transfer ability. Manages chest tube well. Reports he has been getting out of bed frequently to chair and restroom. Feels well other than some mild SOB with walking. SpO2 >98% on room air during therapy evaluation today. Eager to return home once stable. See below for any follow-up Physical Therapy or equipment needs. PT is signing off. Thank you for this referral.     Follow Up Recommendations No PT follow up    Equipment Recommendations  None recommended by PT    Recommendations for Other Services       Precautions / Restrictions Precautions Precautions: Fall Precaution Comments: chest tube Restrictions Weight Bearing Restrictions: No      Mobility  Bed Mobility Overal bed mobility: Modified Independent                Transfers Overall transfer level: Modified independent Equipment used: None             General transfer comment: No issues with transfer. Takes his time due to chest tube, appropriately manages lines.  Ambulation/Gait Ambulation/Gait assistance: Modified independent  (Device/Increase time) Gait Distance (Feet): 250 Feet Assistive device: None Gait Pattern/deviations: WFL(Within Functional Limits)   Gait velocity interpretation: >4.37 ft/sec, indicative of normal walking speed General Gait Details: Pt demonstrates good gait mechanics. No instabilty or loss of balance. Aware of chest tubing while therapist carried chest tube seal.  Minimal dyspnea, SpO2 99% on room air during gait with HR in upper 90s.  Stairs Stairs: (decline)          Wheelchair Mobility    Modified Rankin (Stroke Patients Only)       Balance Overall balance assessment: No apparent balance deficits (not formally assessed)                                           Pertinent Vitals/Pain Pain Assessment: 0-10 Pain Score: 1  Pain Location: tube site Pain Descriptors / Indicators: Discomfort Pain Intervention(s): Monitored during session    Home Living Family/patient expects to be discharged to:: Private residence Living Arrangements: Spouse/significant other Available Help at Discharge: Family;Available PRN/intermittently Type of Home: House Home Access: Stairs to enter Entrance Stairs-Rails: Right;Left;Can reach both Entrance Stairs-Number of Steps: 4 Home Layout: One level Home Equipment: Grab bars - tub/shower      Prior Function Level of Independence: Independent         Comments: pt was driving, shopping, assisted wife with home management;pt is on disability wife works during the day     [place] Hand: Right    Extremity/Trunk Assessment   Upper  Extremity Assessment Upper Extremity Assessment: Defer to OT evaluation    Lower Extremity Assessment Lower Extremity Assessment: Overall WFL for tasks assessed    Cervical / Trunk Assessment Cervical / Trunk Assessment: Normal  Communication   Communication: No difficulties  Cognition Arousal/Alertness: Awake/alert Behavior During Therapy: WFL for tasks  assessed/performed Overall Cognitive Status: Within Functional Limits for tasks assessed                                        General Comments General comments (skin integrity, edema, etc.): HR 108 at rest; HR 120 with functional mobiltiy     Exercises     Assessment/Plan    PT Assessment Patent does not need any further PT services  PT Problem List         PT Treatment Interventions      PT Goals (Current goals can be found in the Care Plan section)  Acute Rehab PT Goals Patient Stated Goal: to go home before his birthday PT Goal Formulation: All assessment and education complete, DC therapy    Frequency     Barriers to discharge        Co-evaluation               AM-PAC PT "6 Clicks" Mobility  Outcome Measure Help needed turning from your back to your side while in a flat bed without using bedrails?: None Help needed moving from lying on your back to sitting on the side of a flat bed without using bedrails?: None Help needed moving to and from a bed to a chair (including a wheelchair)?: None Help needed standing up from a chair using your arms (e.g., wheelchair or bedside chair)?: None Help needed to walk in hospital room?: A Little(carried chest tube for patient only.) Help needed climbing 3-5 steps with a railing? : None 6 Click Score: 23    End of Session Equipment Utilized During Treatment: Gait belt Activity Tolerance: Patient tolerated treatment well Patient left: in bed;with call bell/phone within reach        Time: 4010-2725 PT Time Calculation (min) (ACUTE ONLY): 17 min   Charges:   PT Evaluation $PT Eval Low Complexity: [place], PT, DPT  Ellouise Newer 04/13/2019, 2:36 PM

## 2019-04-13 NOTE — Progress Notes (Addendum)
      [address]       [place] [address]             [phone]       3 Days Post-Op Procedure(s) (LRB): VIDEO BRONCHOSCOPY WITH INSERTION OF INTERBRONCHIAL VALVE (IBV) (N/A)  Subjective: Patient without complaints this am. He hopes to be home by his birthday, which is in 7 days.  Objective: Vital signs in last 24 hours: Temp:  [97.5 F (36.4 C)-97.9 F (36.6 C)] 97.9 F (36.6 C) (08/01 0446) Pulse Rate:  [82-96] 82 (08/01 0446) Cardiac Rhythm: Normal sinus rhythm (07/31 1905) Resp:  [13-17] 13 (08/01 0446) BP: (138-141)/(81-89) 141/85 (08/01 0446) SpO2:  [96 %-97 %] 97 % (08/01 0446)     Intake/Output from previous day: 07/31 0701 - 08/01 0700 In: 640 [P.O.:640] Out: 1105 [Urine:1075; Chest Tube:30]   Physical Exam:  Cardiovascular: RRR Pulmonary: Clear to auscultation on right and audible squeak on the left with inspiration Extremities: No lower extremity edema. Wounds: Clean and dry.  No erythema or signs of infection. Chest Tube: to suction, +1 air leak  Lab Results: CBC: No results for input(s): WBC, HGB, HCT, PLT in the last 72 hours. BMET:  Recent Labs    04/11/19 0300 04/13/19 0254  NA 138  --   K 3.9  --   CL 107  --   CO2 23  --   GLUCOSE 105*  --   BUN 11  --   CREATININE 0.79 0.76  CALCIUM 9.0  --     PT/INR: No results for input(s): LABPROT, INR in the last 72 hours. ABG:  INR: Will add last result for INR, ABG once components are confirmed Will add last 4 CBG results once components are confirmed  Assessment/Plan:  1. CV - SR in the 80's. 2.  Pulmonary - S/p intra bronchial valves x 4 for spontaneous pneumothorax and persistent air leak. On room air. Chest tube with 30 cc last 24 hours is to suction and there continues to be a +1 air leak that does not worsen with cough.  CXR this am appears stable. Will continue chest tube to suction for now. Check CXR in am.   Donielle M ZimmermanPA-C 04/13/2019,7:54 AM  [phone]  I have seen and examined the patient and agree with the assessment and plan as outlined.  Small continuous air leak persists.  Keep tube on suction for now but may try water seal tomorrow if CXR stable.  Rexene Alberts, MD 04/13/2019 11:44 AM

## 2019-04-13 NOTE — Progress Notes (Signed)
Occupational Therapy Evaluation Patient Details Name: Dillon Olson MRN: [id number] DOB: 1955/12/11 Today's Date: 04/13/2019    History of Present Illness 63 year old male reformed smoker with past medical history relevant for presumed left lung cancer not amenable to biopsy that was empirically treated with radiation therapy- previously undergone SBRT for presumed Stage 1A NSCLC in '17 (no biopsy).  Admitted to [facility] in [place] on 04/01/2019 with pneumothorax, status post chest tube placement at outside facility. Transferred from [facility] in [place] to [place]  on 04/06/2019 with persistent air leak from the left chest tube requiring CT surgery consult.   Clinical Impression   PTA, pt was living at home with his wife and 2 dogs, pt reports he was independent with ADL/IADL and functional mobility. Pt currently reports he feels he is close to baseline. Pt reported he completed bathing and grooming with S from staff prior to OT arrival. Pt demonstrates modified independence with ADL completion. Pt requires S for longer duration activity due to HR, at rest HR 108bpm during light functional mobility HR increased to 120bpm. Educated pt on energy conservation strategies and activity progression.  Feel pt would benefit from continued skilled acute OT services, anticipate he will continue to progress towards baseline with support from wife at d/c without additional OT needs. Will continue to follow acutely to address energy conservation strategies and activity progression.     Follow Up Recommendations  No OT follow up    Equipment Recommendations  None recommended by OT    Recommendations for Other Services       Precautions / Restrictions Precautions Precautions: Fall Precaution Comments: chest tube Restrictions Weight Bearing Restrictions: No      Mobility Bed Mobility Overal bed mobility: Modified Independent                Transfers Overall transfer  level: Modified independent Equipment used: None             General transfer comment: pt demonstrated good balancing with sudden direction changes and sudden stops, no LOB noted;pt able to bend over to pick object up off floor, no LOB    Balance Overall balance assessment: No apparent balance deficits (not formally assessed)                                         ADL either performed or assessed with clinical judgement   ADL Overall ADL's : At baseline                                     Functional mobility during ADLs: Supervision/safety General ADL Comments: pt reports he feels he is functioning close to baseline;pt reported performing bathing and grooming with S prior to OT arrival pt reports he has been ambulating to/from bathroom independently;Pt demonstrated safe and appropriate management of chest tube with functional mobility;educated pt about activity progression and pacing himself;pts HR 108 resting and 120 with functional mobiltiy     Vision Patient Visual Report: No change from baseline       Perception     Praxis      Pertinent Vitals/Pain Pain Assessment: 0-10 Pain Score: 2  Pain Location: pt did not specify; reported discomfort Pain Descriptors / Indicators: Discomfort Pain Intervention(s): Limited activity within patient's tolerance;Monitored during session  Hand Dominance Right   Extremity/Trunk Assessment Upper Extremity Assessment Upper Extremity Assessment: Overall WFL for tasks assessed   Lower Extremity Assessment Lower Extremity Assessment: Defer to PT evaluation   Cervical / Trunk Assessment Cervical / Trunk Assessment: Normal   Communication Communication Communication: No difficulties   Cognition Arousal/Alertness: Awake/alert Behavior During Therapy: WFL for tasks assessed/performed Overall Cognitive Status: Within Functional Limits for tasks assessed                                      General Comments  HR 108 at rest; HR 120 with functional mobiltiy     Exercises     Shoulder Instructions      Home Living Family/patient expects to be discharged to:: Private residence Living Arrangements: Spouse/significant other Available Help at Discharge: Family;Available PRN/intermittently Type of Home: House Home Access: Stairs to enter [employer] of Steps: 4 Entrance Stairs-Rails: Right;Left;Can reach both Home Layout: One level     Bathroom Shower/Tub: Occupational psychologist: Standard     Home Equipment: Grab bars - tub/shower          Prior Functioning/Environment Level of Independence: Independent        Comments: pt was driving, shopping, assisted wife with home management;pt is on disability wife works during the day        OT Problem List: Decreased activity tolerance;Decreased safety awareness;Cardiopulmonary status limiting activity;Pain      OT Treatment/Interventions: Self-care/ADL training;Energy conservation;Therapeutic activities;Patient/family education    OT Goals(Current goals can be found in the care plan section) Acute Rehab OT Goals Patient Stated Goal: to go home OT Goal Formulation: With patient Time For Goal Achievement: 04/27/19 Potential to Achieve Goals: Good ADL Goals Additional ADL Goal #1: Pt will independently demonstrate 3 energy conservation strategies during ADL completion.  OT Frequency: Min 2X/week   Barriers to D/C:            Co-evaluation              AM-PAC OT "6 Clicks" Daily Activity     Outcome Measure Help from another person eating meals?: None Help from another person taking care of personal grooming?: None Help from another person toileting, which includes using toliet, bedpan, or urinal?: None Help from another person bathing (including washing, rinsing, drying)?: None Help from another person to put on and taking off regular upper body clothing?: None Help  from another person to put on and taking off regular lower body clothing?: None 6 Click Score: 24   End of Session Equipment Utilized During Treatment: (chest tube) Nurse Communication: Mobility status  Activity Tolerance: Patient tolerated treatment well Patient left: in chair;with call bell/phone within reach  OT Visit Diagnosis: Other abnormalities of gait and mobility (R26.89);Pain Pain - part of body: (pt did not specify)                Time: 8841-6606 OT Time Calculation (min): 17 min Charges:  OT General Charges $OT Visit: 1 Visit OT Evaluation $OT Eval Low Complexity: [place] OTR/L Acute Rehabilitation Services Office: [place] 04/13/2019, 11:59 AM

## 2019-04-13 NOTE — Progress Notes (Signed)
PROGRESS NOTE  Dillon Olson ZOX:[id number] DOB: 27-Aug-1956 DOA: 04/06/2019 PCP: System, Pcp Not In  HPI/Recap of past 8 hours:  63 year old male reformed smoker with past medical history relevant for presumed left lung cancer not amenable to biopsy that was empirically treated with radiation therapy- previously undergone SBRT for presumed Stage 1A NSCLC in '17 (no biopsy). Admitted to [facility] in [place] on 04/01/2019 with pneumothorax, status post chest tube placement at outside facility. Transferred from [facility] in [place] to [place] on 04/06/2019 with persistent air leak from the left chest tube requiring CT surgery consult  --presumed left upper lobe non-small cell lung cancer- s/p Prior Ling Radiation -Now being treated for spontaneous pneumothorax- with Lt sided Chest tube with persistent leak- CT surgery to decide on VATS Versus Endobronchial valves.  7/28:Patient noted to have increased chest tube airleak overnight without any respiratory distress. CT surgery had noted that chest tube was dislodged and has replaced itthis morning.  7/29: Plans for patient undergo video bronchoscopy with insertion of intrabronchial valve x4 today per CT surgery. Completed.  04/13/19: Patient was seen and examined at his bedside this morning.  His pain at chest tube insertion is well controlled on current pain management.  He denies dyspnea or pleuritic pain.  Still has continuous air leak per CTS.  Plan to keep tube on suction for now but may try waterseal tomorrow if chest x-ray stable.   Assessment/Plan: Principal Problem:   Pneumothorax on left Active Problems:   Solitary pulmonary nodule/presumed left lung cancer-status post prior radiation therapy   COPD with emphysema/Bleps   FTT (failure to thrive) in adult/left lung cancer related    Spontaneous pneumothorax post bronchoscopy with intrabronchial valve placement x4 by CTS on 04/10/19 Persistent small  continuous air leak per CTS Keep tube on suction May try waterseal tomorrow if chest x-ray stable States pain is present only when he moves. Continue to optimize pain control Monitor O2 sat and maintain above 92% Serial chest x-ray per cardiothoracic surgery  Non small cell left lung cancer post radiation Follow up outpatient at [facility] and [facility]  FTT likely 2/2 to malignancy C/w megace and oral supplements Labs and vital signs reviewed and are stable. Advised to increase oral protein calorie intake as tolerated.  Patient is receptive.  COPD/emphysema w blebs C/w bronchodilators Maintain O2 sat >92% Currently on room air with saturation above 94%   DVT prophylaxis:Lovenox subcu daily Code Status:Full Family Communication:None at bedside Disposition Plan:Per CT Surgery   Consultants:  CT surgery  Procedures:  CT placement  Replacement 7/28  Video bronchoscopy with insertion of intrabronchial valve 04/10/19     Objective: Vitals:   04/12/19 2014 04/13/19 0446 04/13/19 0842 04/13/19 1254  BP: 139/81 (!) 141/85 (!) 138/92   Pulse: 96 82 68   Resp: 15 13 12 16   Temp: (!) 97.5 F (36.4 C) 97.9 F (36.6 C) 98.4 F (36.9 C)   TempSrc: Oral Oral Oral   SpO2: 96% 97% 94%     Intake/Output Summary (Last 24 hours) at 04/13/2019 1450 Last data filed at 04/13/2019 1123 Gross per 24 hour  Intake 440 ml  Output 855 ml  Net -415 ml   There were no vitals filed for this visit.  Exam:  . General: 63 y.o. year-old male Emaciated in no acute distress.  Alert and oriented x3.   . Cardiovascular: Regular rate and rhythm no rubs or gallops.  No JVD or thyromegaly noted.   Marland Kitchen  Respiratory: Clear to auscultation no wheezes or rales.  Poor inspiratory effort.  Left chest tube in place.   . Abdomen: Soft nontender nondistended normal bowel sounds present. . Musculoskeletal: No lower extremity edema.  2 out of 4 pulses in all 4 extremities. Marland Kitchen Psychiatry: Mood  is appropriate for condition and setting.  Data Reviewed: CBC: Recent Labs  Lab 04/06/19 1951 04/07/19 0946 04/10/19 0358  WBC 11.7* 8.5 8.1  NEUTROABS 7.6  --   --   HGB 15.0 14.9 13.3  HCT 43.5 43.1 39.4  MCV 93.8 94.1 94.9  PLT 302 275 301   Basic Metabolic Panel: Recent Labs  Lab 04/06/19 1951 04/07/19 0946 04/10/19 0358 04/11/19 0300 04/13/19 0254  NA 135 138 137 138  --   K 3.9 4.6 4.2 3.9  --   CL 99 102 106 107  --   CO2 24 25 24 23   --   GLUCOSE 106* 95 103* 105*  --   BUN 13 13 10 11   --   CREATININE 0.87 0.97 0.85 0.79 0.76  CALCIUM 9.4 9.7 9.3 9.0  --   MG 1.9  --   --   --   --   PHOS 3.7  --   --   --   --    GFR: CrCl cannot be calculated (Unknown ideal weight.). Liver Function Tests: Recent Labs  Lab 04/06/19 1951  AST 18  ALT 15  ALKPHOS 95  BILITOT 0.4  PROT 6.9  ALBUMIN 3.6   No results for input(s): LIPASE, AMYLASE in the last 168 hours. No results for input(s): AMMONIA in the last 168 hours. Coagulation Profile: No results for input(s): INR, PROTIME in the last 168 hours. Cardiac Enzymes: No results for input(s): CKTOTAL, CKMB, CKMBINDEX, TROPONINI in the last 168 hours. BNP (last 3 results) No results for input(s): PROBNP in the last 8760 hours. HbA1C: No results for input(s): HGBA1C in the last 72 hours. CBG: No results for input(s): GLUCAP in the last 168 hours. Lipid Profile: No results for input(s): CHOL, HDL, LDLCALC, TRIG, CHOLHDL, LDLDIRECT in the last 72 hours. Thyroid Function Tests: No results for input(s): TSH, T4TOTAL, FREET4, T3FREE, THYROIDAB in the last 72 hours. Anemia Panel: No results for input(s): VITAMINB12, FOLATE, FERRITIN, TIBC, IRON, RETICCTPCT in the last 72 hours. Urine analysis:    Component Value Date/Time   COLORURINE YELLOW 04/06/2019 2020   APPEARANCEUR CLEAR 04/06/2019 2020   LABSPEC 1.015 04/06/2019 2020   PHURINE 6.0 04/06/2019 2020   GLUCOSEU NEGATIVE 04/06/2019 2020   HGBUR NEGATIVE  04/06/2019 2020   BILIRUBINUR NEGATIVE 04/06/2019 2020   KETONESUR NEGATIVE 04/06/2019 2020   PROTEINUR NEGATIVE 04/06/2019 2020   NITRITE NEGATIVE 04/06/2019 2020   LEUKOCYTESUR NEGATIVE 04/06/2019 2020   Sepsis Labs: @LABRCNTIP (procalcitonin:4,lacticidven:4)  ) Recent Results (from the past 240 hour(s))  SARS Coronavirus 2 (CEPHEID - Performed in [place] hospital lab), Hosp Order     Status: None   Collection Time: 04/07/19 10:30 AM   Specimen: Nasopharyngeal Swab  Result Value Ref Range Status   SARS Coronavirus 2 NEGATIVE NEGATIVE Final    Comment: (NOTE) If result is NEGATIVE SARS-CoV-2 target nucleic acids are NOT DETECTED. The SARS-CoV-2 RNA is generally detectable in upper and lower  respiratory specimens during the acute phase of infection. The lowest  concentration of SARS-CoV-2 viral copies this assay can detect is 250  copies / mL. A negative result does not preclude SARS-CoV-2 infection  and should not be used as  the sole basis for treatment or other  patient management decisions.  A negative result may occur with  improper specimen collection / handling, submission of specimen other  than nasopharyngeal swab, presence of viral mutation(s) within the  areas targeted by this assay, and inadequate number of viral copies  (<250 copies / mL). A negative result must be combined with clinical  observations, patient history, and epidemiological information. If result is POSITIVE SARS-CoV-2 target nucleic acids are DETECTED. The SARS-CoV-2 RNA is generally detectable in upper and lower  respiratory specimens dur ing the acute phase of infection.  Positive  results are indicative of active infection with SARS-CoV-2.  Clinical  correlation with patient history and other diagnostic information is  necessary to determine patient infection status.  Positive results do  not rule out bacterial infection or co-infection with other viruses. If result is PRESUMPTIVE POSTIVE  SARS-CoV-2 nucleic acids MAY BE PRESENT.   A presumptive positive result was obtained on the submitted specimen  and confirmed on repeat testing.  While 2019 novel coronavirus  (SARS-CoV-2) nucleic acids may be present in the submitted sample  additional confirmatory testing may be necessary for epidemiological  and / or clinical management purposes  to differentiate between  SARS-CoV-2 and other Sarbecovirus currently known to infect humans.  If clinically indicated additional testing with an alternate test  methodology [phone]) is advised. The SARS-CoV-2 RNA is generally  detectable in upper and lower respiratory sp ecimens during the acute  phase of infection. The expected result is Negative. Fact Sheet for Patients:  StrictlyIdeas.no Fact Sheet for Healthcare Providers: BankingDealers.co.za This test is not yet approved or cleared by the [place] FDA and has been authorized for detection and/or diagnosis of SARS-CoV-2 by FDA under an Emergency Use Authorization (EUA).  This EUA will remain in effect (meaning this test can be used) for the duration of the COVID-19 declaration under Section 564(b)(1) of the Act, 21 U.S.C. section 360bbb-3(b)(1), unless the authorization is terminated or revoked sooner. Performed at [facility], [place] [address], [place], [place] [address]       Studies: Dg Chest Port 1 View  Result Date: 04/13/2019 CLINICAL DATA:  Left-sided pneumothorax follow-up EXAM: PORTABLE CHEST 1 VIEW COMPARISON:  Chest radiograph 04/12/2019 FINDINGS: Monitoring leads overlie the patient. Left chest tube remains in position. Stable cardiac and mediastinal contours. Biapical bullous changes. Stable small left apical and basilar pneumothorax. Redemonstrated cavitary lesion left lung apex. Osseous structures unremarkable. IMPRESSION: Stable small left apical and basilar pneumothorax. Left chest tube remains in place.  Apical bullous changes and left apical cavitary lesion. Electronically Signed   By: Lovey Newcomer M.D.   On: 04/13/2019 07:59    Scheduled Meds: . acyclovir ointment   Topical Q4H  . enoxaparin (LOVENOX) injection  40 mg Subcutaneous Q24H  . feeding supplement (ENSURE ENLIVE)  237 mL Oral TID BM  . lidocaine  1 patch Transdermal Q24H  . megestrol  400 mg Oral Daily  . mirtazapine  15 mg Oral QHS  . senna-docusate  2 tablet Oral BID    Continuous Infusions: . lactated ringers Stopped (04/10/19 1224)     LOS: 7 days     Kayleen Memos, MD [facility] Pager [phone]  If 7PM-7AM, please contact night-coverage [email or web address] Password [email or web address] 04/13/2019, 2:50 PM

## 2019-04-13 NOTE — Plan of Care (Signed)
  Problem: Education: Goal: Knowledge of General Education information will improve Description Including pain rating scale, medication(s)/side effects and non-pharmacologic comfort measures Outcome: Progressing   Problem: Health Behavior/Discharge Planning: Goal: Ability to manage health-related needs will improve Outcome: Progressing   

## 2019-04-14 ENCOUNTER — Inpatient Hospital Stay (HOSPITAL_COMMUNITY): Payer: No Typology Code available for payment source

## 2019-04-14 DIAGNOSIS — J9311 Primary spontaneous pneumothorax: Secondary | ICD-10-CM

## 2019-04-14 MED ORDER — GENERIC EXTERNAL MEDICATION
Status: DC
Start: ? — End: 2019-04-14

## 2019-04-14 MED ORDER — ENOXAPARIN SODIUM 30 MG/0.3ML ~~LOC~~ SOLN
30.0000 mg | SUBCUTANEOUS | Status: DC
  Administered 2019-04-14 – 2019-04-21 (×8): 30 mg via SUBCUTANEOUS
  Filled 2019-04-14 (×8): qty 0.3

## 2019-04-14 NOTE — Progress Notes (Addendum)
      [address]       [place] [address]             [phone]       4 Days Post-Op Procedure(s) (LRB): VIDEO BRONCHOSCOPY WITH INSERTION OF INTERBRONCHIAL VALVE (IBV) (N/A)  Subjective: Patient without complaints this am.   Objective: Vital signs in last 24 hours: Temp:  [98.1 F (36.7 C)-98.9 F (37.2 C)] 98.1 F (36.7 C) (08/02 0411) Pulse Rate:  [68-97] 97 (08/01 1931) Cardiac Rhythm: Normal sinus rhythm (08/01 2000) Resp:  [12-26] 18 (08/01 1931) BP: (124-138)/(89-92) 124/89 (08/01 1931) SpO2:  [94 %-99 %] 99 % (08/01 1931) Weight:  [43.8 kg] 43.8 kg (08/01 1659)     Intake/Output from previous day: 08/01 0701 - 08/02 0700 In: 720 [P.O.:720] Out: 1741 [Urine:1700; Stool:1; Chest Tube:40]   Physical Exam:  Cardiovascular: RRR Pulmonary: Clear to auscultation on right and audible rub on the left with inspiration Extremities: No lower extremity edema. Wounds: Clean and dry.  No erythema or signs of infection. Chest Tube: to suction, +1 air leak  Lab Results: CBC: No results for input(s): WBC, HGB, HCT, PLT in the last 72 hours. BMET:  Recent Labs    04/13/19 0254  CREATININE 0.76    PT/INR: No results for input(s): LABPROT, INR in the last 72 hours. ABG:  INR: Will add last result for INR, ABG once components are confirmed Will add last 4 CBG results once components are confirmed  Assessment/Plan:  1. CV - SR in the 80's. 2.  Pulmonary - S/p intra bronchial valves x 4 for spontaneous pneumothorax and persistent air leak. On room air. Chest tube with 40 cc last 24 hours is to suction and there continues to be a +1 air leak that does not worsen with cough.  CXR this am ordered but not taken yet. Will place chest tube to water seal soon. If continues with persistent air leak and tolerates water seal, may be able to consider mini express. Check CXR in am.   Donielle M ZimmermanPA-C 04/14/2019,7:42 AM [phone]    I have seen  and examined the patient and agree with the assessment and plan as outlined.  Small air leak persists.  Will place tube to water seal.   Rexene Alberts, MD 04/14/2019 10:36 AM

## 2019-04-14 NOTE — Plan of Care (Signed)
  Problem: Education: Goal: Knowledge of General Education information will improve Description Including pain rating scale, medication(s)/side effects and non-pharmacologic comfort measures Outcome: Progressing   Problem: Health Behavior/Discharge Planning: Goal: Ability to manage health-related needs will improve Outcome: Progressing   

## 2019-04-14 NOTE — Progress Notes (Signed)
PROGRESS NOTE  Dillon Olson GLO:[id number] DOB: 1956/06/18 DOA: 04/06/2019 PCP: System, Pcp Not In  HPI/Recap of past 32 hours:  63 year old male reformed smoker with past medical history relevant for presumed left lung cancer not amenable to biopsy that was empirically treated with radiation therapy- previously undergone SBRT for presumed Stage 1A NSCLC in '17 (no biopsy). Admitted to [facility] in [place] on 04/01/2019 with pneumothorax, status post chest tube placement at outside facility. Transferred from [facility] in [place] to [place] on 04/06/2019 with persistent air leak from the left chest tube requiring CT surgery consult    04/14/19: Patient was seen and examined at his bedside this morning.  His pain at chest tube insertion is well controlled on current pain management.  He denies dyspnea or pleuritic pain.  Still has continuous air leak per CTS, but stable with cough.  Plan to place to waterseal today.   Assessment/Plan: Principal Problem:   Pneumothorax on left Active Problems:   Solitary pulmonary nodule/presumed left lung cancer-status post prior radiation therapy   COPD with emphysema/Bleps   FTT (failure to thrive) in adult/left lung cancer related    Spontaneous pneumothorax  Patient initially presented to [facility] with pleuritic chest pain.  Found to have a pneumothorax in which a chest tube was placed.  Transfer to [facility] for cardiothoracic surgery evaluation.  Patient underwent bronchoscopy with intrabronchial valve placement x 4 by CTS on 04/10/19 --Continues with chest tube in place to low wall suction with mild air leak --Chest tube output past 24 hours 40 mL --CXR 8/2 with multiple left side intrabronchial valves, trace residual left pneumothorax --CTS to change chest tube to waterseal today --Repeat chest x-ray in the morning --Further per cardiothoracic surgery  Non small cell left lung cancer post  radiation --Follow up outpatient at [facility] and [facility]  FTT likely 2/2 to malignancy --Continue Megace and oral supplements --Advised to increase oral protein calorie intake as tolerated.    COPD/emphysema w blebs Currently oxygenating well on room air. --Continue nebs as needed   DVT prophylaxis: Lovenox Code Status:Full Family Communication:None at bedside Disposition Plan:Per CT Surgery   Consultants:  CT surgery  Procedures:  CT placement  Chest tube replacement 7/28  Video bronchoscopy with insertion of intrabronchial valve 04/10/19     Objective: Vitals:   04/14/19 0911 04/14/19 0920 04/14/19 1340 04/14/19 1457  BP: 132/82   (!) 150/98  Pulse: 88 91    Resp: 17 15 20 13   Temp: 98 F (36.7 C)     TempSrc: Oral     SpO2: 98% 99%    Weight:      Height:        Intake/Output Summary (Last 24 hours) at 04/14/2019 1518 Last data filed at 04/14/2019 1300 Gross per 24 hour  Intake 720 ml  Output 2521 ml  Net -1801 ml   Filed Weights   04/13/19 1659  Weight: 43.8 kg    Exam:  . General: 63 y.o. year-old male Emaciated in no acute distress.  Alert and oriented x3.   . Cardiovascular: Regular rate and rhythm no rubs or gallops.  No JVD or thyromegaly noted.   Marland Kitchen Respiratory: Clear to auscultation no wheezes or rales.  Poor inspiratory effort.  Left chest tube in place.   . Abdomen: Soft nontender nondistended normal bowel sounds present. . Musculoskeletal: No lower extremity edema.  2 out of 4 pulses in all 4 extremities. Marland Kitchen Psychiatry:  Mood is appropriate for condition and setting.  Data Reviewed: CBC: Recent Labs  Lab 04/10/19 0358  WBC 8.1  HGB 13.3  HCT 39.4  MCV 94.9  PLT 601   Basic Metabolic Panel: Recent Labs  Lab 04/10/19 0358 04/11/19 0300 04/13/19 0254  NA 137 138  --   K 4.2 3.9  --   CL 106 107  --   CO2 24 23  --   GLUCOSE 103* 105*  --   BUN 10 11  --   CREATININE 0.85 0.79 0.76  CALCIUM 9.3 9.0  --     GFR: Estimated Creatinine Clearance: 59.3 mL/min (by C-G formula based on SCr of 0.76 mg/dL). Liver Function Tests: No results for input(s): AST, ALT, ALKPHOS, BILITOT, PROT, ALBUMIN in the last 168 hours. No results for input(s): LIPASE, AMYLASE in the last 168 hours. No results for input(s): AMMONIA in the last 168 hours. Coagulation Profile: No results for input(s): INR, PROTIME in the last 168 hours. Cardiac Enzymes: No results for input(s): CKTOTAL, CKMB, CKMBINDEX, TROPONINI in the last 168 hours. BNP (last 3 results) No results for input(s): PROBNP in the last 8760 hours. HbA1C: No results for input(s): HGBA1C in the last 72 hours. CBG: No results for input(s): GLUCAP in the last 168 hours. Lipid Profile: No results for input(s): CHOL, HDL, LDLCALC, TRIG, CHOLHDL, LDLDIRECT in the last 72 hours. Thyroid Function Tests: No results for input(s): TSH, T4TOTAL, FREET4, T3FREE, THYROIDAB in the last 72 hours. Anemia Panel: No results for input(s): VITAMINB12, FOLATE, FERRITIN, TIBC, IRON, RETICCTPCT in the last 72 hours. Urine analysis:    Component Value Date/Time   COLORURINE YELLOW 04/06/2019 2020   APPEARANCEUR CLEAR 04/06/2019 2020   LABSPEC 1.015 04/06/2019 2020   PHURINE 6.0 04/06/2019 2020   GLUCOSEU NEGATIVE 04/06/2019 2020   HGBUR NEGATIVE 04/06/2019 2020   BILIRUBINUR NEGATIVE 04/06/2019 2020   KETONESUR NEGATIVE 04/06/2019 2020   PROTEINUR NEGATIVE 04/06/2019 2020   NITRITE NEGATIVE 04/06/2019 2020   LEUKOCYTESUR NEGATIVE 04/06/2019 2020   Sepsis Labs: @LABRCNTIP (procalcitonin:4,lacticidven:4)  ) Recent Results (from the past 240 hour(s))  SARS Coronavirus 2 (CEPHEID - Performed in [place] hospital lab), Hosp Order     Status: None   Collection Time: 04/07/19 10:30 AM   Specimen: Nasopharyngeal Swab  Result Value Ref Range Status   SARS Coronavirus 2 NEGATIVE NEGATIVE Final    Comment: (NOTE) If result is NEGATIVE SARS-CoV-2 target nucleic acids are  NOT DETECTED. The SARS-CoV-2 RNA is generally detectable in upper and lower  respiratory specimens during the acute phase of infection. The lowest  concentration of SARS-CoV-2 viral copies this assay can detect is 250  copies / mL. A negative result does not preclude SARS-CoV-2 infection  and should not be used as the sole basis for treatment or other  patient management decisions.  A negative result may occur with  improper specimen collection / handling, submission of specimen other  than nasopharyngeal swab, presence of viral mutation(s) within the  areas targeted by this assay, and inadequate number of viral copies  (<250 copies / mL). A negative result must be combined with clinical  observations, patient history, and epidemiological information. If result is POSITIVE SARS-CoV-2 target nucleic acids are DETECTED. The SARS-CoV-2 RNA is generally detectable in upper and lower  respiratory specimens dur ing the acute phase of infection.  Positive  results are indicative of active infection with SARS-CoV-2.  Clinical  correlation with patient history and other diagnostic information is  necessary to determine patient infection status.  Positive results do  not rule out bacterial infection or co-infection with other viruses. If result is PRESUMPTIVE POSTIVE SARS-CoV-2 nucleic acids MAY BE PRESENT.   A presumptive positive result was obtained on the submitted specimen  and confirmed on repeat testing.  While 2019 novel coronavirus  (SARS-CoV-2) nucleic acids may be present in the submitted sample  additional confirmatory testing may be necessary for epidemiological  and / or clinical management purposes  to differentiate between  SARS-CoV-2 and other Sarbecovirus currently known to infect humans.  If clinically indicated additional testing with an alternate test  methodology [phone]) is advised. The SARS-CoV-2 RNA is generally  detectable in upper and lower respiratory sp ecimens  during the acute  phase of infection. The expected result is Negative. Fact Sheet for Patients:  StrictlyIdeas.no Fact Sheet for Healthcare Providers: BankingDealers.co.za This test is not yet approved or cleared by the [place] FDA and has been authorized for detection and/or diagnosis of SARS-CoV-2 by FDA under an Emergency Use Authorization (EUA).  This EUA will remain in effect (meaning this test can be used) for the duration of the COVID-19 declaration under Section 564(b)(1) of the Act, 21 U.S.C. section 360bbb-3(b)(1), unless the authorization is terminated or revoked sooner. Performed at [facility], [place] [address] [place], [place] [address]       Studies: Dg Chest Port 1 View  Result Date: 04/14/2019 CLINICAL DATA:  63 year old male s/p spontaneous pneumothorax. Postoperative day 4 status post VIDEO BRONCHOSCOPY WITH INSERTION OF INTERBRONCHIAL VALVE (IBV) x 4. EXAM: PORTABLE CHEST 1 VIEW COMPARISON:  A1 20 and earlier. FINDINGS: Portable AP semi upright view at 0815 hours. Stable left side pigtail type chest tube. Multiple left perihilar intrabronchial valves are visible. Architectural distortion in the left lung apex is stable. Trace residual pneumothorax, most apparent along the surface of the left diaphragm and mediastinum. Right upper lobe predominant emphysema with mild architectural distortion. Normal cardiac size and mediastinal contours. Visualized tracheal air column is within normal limits. No acute pulmonary opacity. Negative visible bowel gas pattern. Prior cervical ACDF. IMPRESSION: 1. Stable left chest tube. Trace residual left pneumothorax. 2. Multiple left side intrabronchial valves. Chronic lung disease. No new cardiopulmonary abnormality. Electronically Signed   By: Genevie Ann M.D.   On: 04/14/2019 10:59    Scheduled Meds: . acyclovir ointment   Topical Q4H  . enoxaparin (LOVENOX) injection  30 mg  Subcutaneous Q24H  . feeding supplement (ENSURE ENLIVE)  237 mL Oral TID BM  . lidocaine  1 patch Transdermal Q24H  . megestrol  400 mg Oral Daily  . mirtazapine  15 mg Oral QHS  . senna-docusate  2 tablet Oral BID    Continuous Infusions: . lactated ringers Stopped (04/10/19 1224)     LOS: 8 days     Oluwanifemi Petitti J [place] DO [facility] Pager [phone]  If 7PM-7AM, please contact night-coverage [email or web address] Password [facility] 04/14/2019, 3:18 PM

## 2019-04-15 ENCOUNTER — Inpatient Hospital Stay (HOSPITAL_COMMUNITY): Payer: No Typology Code available for payment source

## 2019-04-15 MED ORDER — GENERIC EXTERNAL MEDICATION
Status: DC
Start: ? — End: 2019-04-15

## 2019-04-15 NOTE — Progress Notes (Signed)
OT Cancellation Note  Patient Details Name: Dillon Olson MRN: [id number] DOB: [birth year]   Cancelled Treatment:    Reason Eval/Treat Not Completed: Other (comment) Met with pt in bed, politely declining further OT intervention. He states he feels comfortable with ECS learned and feels baseline at BADL at this time. OT will sign off. Please reconsult if changes arise.   Zenovia Jarred, MSOT, OTR/L Behavioral Health OT/ Acute Relief OT [facility] Office: [phone]  Zenovia Jarred 04/15/2019, 11:27 AM

## 2019-04-15 NOTE — Progress Notes (Signed)
      [address]       [place] [address]             [phone]       5 Days Post-Op Procedure(s) (LRB): VIDEO BRONCHOSCOPY WITH INSERTION OF INTERBRONCHIAL VALVE (IBV) (N/A)  Subjective: Patient's left chest tube was placed to water seal yesterday. This am's chest x ray showed left pneumothorax with tension. Patient noticed a change in his breathing.    Objective: Vital signs in last 24 hours: Temp:  [97.7 F (36.5 C)-98.2 F (36.8 C)] 97.7 F (36.5 C) (08/03 0508) Pulse Rate:  [81-96] 81 (08/03 0508) Cardiac Rhythm: Normal sinus rhythm (08/03 0701) Resp:  [13-20] 17 (08/03 0508) BP: (116-150)/(82-98) 116/84 (08/03 0508) SpO2:  [96 %-99 %] 96 % (08/03 0508)     Intake/Output from previous day: 08/02 0701 - 08/03 0700 In: 720 [P.O.:720] Out: 1760 [Urine:1750; Chest Tube:10]   Physical Exam:  Cardiovascular: RRR Pulmonary: Clear to auscultation on right and diminished at left apex Extremities: No lower extremity edema. Wounds: Clean and dry.  No erythema or signs of infection. Chest Tube: to suction, +1 air leak  Lab Results: CBC: No results for input(s): WBC, HGB, HCT, PLT in the last 72 hours. BMET:  Recent Labs    04/13/19 0254  CREATININE 0.76    PT/INR: No results for input(s): LABPROT, INR in the last 72 hours. ABG:  INR: Will add last result for INR, ABG once components are confirmed Will add last 4 CBG results once components are confirmed  Assessment/Plan:  1. CV - SR in the 80's. 2.  Pulmonary - S/p intra bronchial valves x 4 for spontaneous pneumothorax and persistent air leak. On room air. Chest tube with 10 cc last 24 hours is to suction and there continues to be a +1 air leak that does not worsen with cough.  CXR this am showed left pneumothorax with tension, tiny left pleural effusion. Check CXR in am.   Donielle M ZimmermanPA-C 04/15/2019,8:45 AM [phone]

## 2019-04-15 NOTE — Progress Notes (Signed)
PROGRESS NOTE  Dillon Olson SHF:[id number] DOB: [birth year] DOA: 04/06/2019 PCP: System, Pcp Not In  HPI/Recap of past 68 hours:  63 year old male reformed smoker with past medical history relevant for presumed left lung cancer not amenable to biopsy that was empirically treated with radiation therapy- previously undergone SBRT for presumed Stage 1A NSCLC in '17 (no biopsy). Admitted to [facility] in [place] on 04/01/2019 with pneumothorax, status post chest tube placement at outside facility. Transferred from [facility] in [place] to [place] on 04/06/2019 with persistent air leak from the left chest tube requiring CT surgery consult    04/15/19: Patient was seen and examined at his bedside this morning.  Patient noticed change in his shortness of breath overnight.  Chest x-ray this morning noted left pneumothorax with tension and his chest tube was placed back on to suction.  Patient without any further complaints this morning.  Denies headache, no chest pain, no palpitations, no abdominal pain.  No acute events overnight per nursing staff. Assessment/Plan: Principal Problem:   Pneumothorax on left Active Problems:   Solitary pulmonary nodule/presumed left lung cancer-status post prior radiation therapy   COPD with emphysema/Bleps   FTT (failure to thrive) in adult/left lung cancer related    Spontaneous pneumothorax  Patient initially presented to [facility] with pleuritic chest pain.  Found to have a pneumothorax in which a chest tube was placed.  [facility] to [facility] for cardiothoracic surgery evaluation.  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    COPD/emphysema w blebs Currently oxygenating well on room air. --Continue nebs as needed   DVT prophylaxis: Lovenox Code Status:Full Family Communication:None at bedside Disposition Plan:Per CT Surgery   Consultants:  CT surgery  Procedures:  CT placement  Chest tube replacement 7/28  Video bronchoscopy with insertion of intrabronchial valve 04/10/19     Objective: Vitals:   04/14/19 2138 04/15/19 0508 04/15/19 0851 04/15/19 1220  BP: 124/89 116/84 140/88 (!) 143/88  Pulse: 96 81  90  Resp: 17 17 16 16   Temp: 98.2 F (36.8 C) 97.7 F (36.5 C) 98 F (36.7 C) 98.3 F (36.8 C)  TempSrc: Oral Oral Oral Oral  SpO2: 97% 96% 96% 96%  Weight:      Height:        Intake/Output Summary (Last 24 hours) at 04/15/2019 1326 Last data filed at 04/15/2019 1025 Gross per 24 hour  Intake 480 ml  Output 1420 ml  Net -940 ml   Filed Weights   04/13/19 1659  Weight: 43.8 kg    Exam:  . General: 63 y.o. year-old male Emaciated in no acute distress.  Alert and oriented x3.   . Cardiovascular: Regular rate and rhythm no rubs or gallops.  No JVD or thyromegaly noted.   Marland Kitchen Respiratory: Clear to auscultation no wheezes or rales.  Poor inspiratory effort.  Left chest tube in place; now placed back on low wall suction . Abdomen: Soft nontender nondistended  normal bowel sounds present. . Musculoskeletal: No lower extremity edema.  2 out of 4 pulses in all 4 extremities. Marland Kitchen Psychiatry: Mood is appropriate for condition and setting.  Data Reviewed: CBC: Recent Labs  Lab 04/10/19 0358  WBC 8.1  HGB 13.3  HCT 39.4  MCV 94.9  PLT 160   Basic Metabolic Panel: Recent Labs  Lab  04/10/19 0358 04/11/19 0300 04/13/19 0254  NA 137 138  --   K 4.2 3.9  --   CL 106 107  --   CO2 24 23  --   GLUCOSE 103* 105*  --   BUN 10 11  --   CREATININE 0.85 0.79 0.76  CALCIUM 9.3 9.0  --    GFR: Estimated Creatinine Clearance: 59.3 mL/min (by C-G formula based on SCr of 0.76 mg/dL). Liver Function Tests: No results for input(s): AST, ALT, ALKPHOS, BILITOT, PROT, ALBUMIN in the last 168 hours. No results for input(s): LIPASE, AMYLASE in the last 168 hours. No results for input(s): AMMONIA in the last 168 hours. Coagulation Profile: No results for input(s): INR, PROTIME in the last 168 hours. Cardiac Enzymes: No results for input(s): CKTOTAL, CKMB, CKMBINDEX, TROPONINI in the last 168 hours. BNP (last 3 results) No results for input(s): PROBNP in the last 8760 hours. HbA1C: No results for input(s): HGBA1C in the last 72 hours. CBG: No results for input(s): GLUCAP in the last 168 hours. Lipid Profile: No results for input(s): CHOL, HDL, LDLCALC, TRIG, CHOLHDL, LDLDIRECT in the last 72 hours. Thyroid Function Tests: No results for input(s): TSH, T4TOTAL, FREET4, T3FREE, THYROIDAB in the last 72 hours. Anemia Panel: No results for input(s): VITAMINB12, FOLATE, FERRITIN, TIBC, IRON, RETICCTPCT in the last 72 hours. Urine analysis:    Component Value Date/Time   COLORURINE YELLOW 04/06/2019 2020   APPEARANCEUR CLEAR 04/06/2019 2020   LABSPEC 1.015 04/06/2019 2020   PHURINE 6.0 04/06/2019 2020   GLUCOSEU NEGATIVE 04/06/2019 2020   HGBUR NEGATIVE 04/06/2019 2020   BILIRUBINUR NEGATIVE 04/06/2019 2020   KETONESUR NEGATIVE 04/06/2019 2020   PROTEINUR NEGATIVE 04/06/2019 2020   NITRITE NEGATIVE 04/06/2019 2020   LEUKOCYTESUR NEGATIVE 04/06/2019 2020   Sepsis Labs: @LABRCNTIP (procalcitonin:4,lacticidven:4)  ) Recent Results (from the past 240 hour(s))  SARS Coronavirus 2 (CEPHEID - Performed in [place] hospital lab), Hosp Order     Status: None   Collection  Time: 04/07/19 10:30 AM   Specimen: Nasopharyngeal Swab  Result Value Ref Range Status   SARS Coronavirus 2 NEGATIVE NEGATIVE Final    Comment: (NOTE) If result is NEGATIVE SARS-CoV-2 target nucleic acids are NOT DETECTED. The SARS-CoV-2 RNA is generally detectable in upper and lower  respiratory specimens during the acute phase of infection. The lowest  concentration of SARS-CoV-2 viral copies this assay can detect is 250  copies / mL. A negative result does not preclude SARS-CoV-2 infection  and should not be used as the sole basis for treatment or other  patient management decisions.  A negative result may occur with  improper specimen collection / handling, submission of specimen other  than nasopharyngeal swab, presence of viral mutation(s) within the  areas targeted by this assay, and inadequate number of viral copies  (<250 copies / mL). A negative result must be combined with clinical  observations, patient history, and epidemiological information. If result is POSITIVE SARS-CoV-2 target nucleic acids are DETECTED. The SARS-CoV-2 RNA is generally detectable in upper and lower  respiratory specimens dur ing the acute phase of infection.  Positive  results are indicative of active infection with SARS-CoV-2.  Clinical  correlation with patient history and other diagnostic information is  necessary to determine patient infection status.  Positive results do  not rule out bacterial infection or co-infection with other viruses. If result is PRESUMPTIVE POSTIVE SARS-CoV-2 nucleic acids MAY BE PRESENT.   A presumptive positive result was obtained on the submitted specimen  and confirmed on repeat testing.  While 2019 novel coronavirus  (SARS-CoV-2) nucleic acids may be present in the submitted sample  additional confirmatory testing may be necessary for epidemiological  and / or clinical management purposes  to differentiate between  SARS-CoV-2 and other Sarbecovirus currently known  to infect humans.  If clinically indicated additional testing with an alternate test  methodology [phone]) is advised. The SARS-CoV-2 RNA is generally  detectable in upper and lower respiratory sp ecimens during the acute  phase of infection. The expected result is Negative. Fact Sheet for Patients:  StrictlyIdeas.no Fact Sheet for Healthcare Providers: BankingDealers.co.za This test is not yet approved or cleared by the [place] FDA and has been authorized for detection and/or diagnosis of SARS-CoV-2 by FDA under an Emergency Use Authorization (EUA).  This EUA will remain in effect (meaning this test can be used) for the duration of the COVID-19 declaration under Section 564(b)(1) of the Act, 21 U.S.C. section 360bbb-3(b)(1), unless the authorization is terminated or revoked sooner. Performed at [facility], [place] [address], [place], [place] [address]       Studies: Dg Chest Port 1 View  Result Date: 04/15/2019 CLINICAL DATA:  Pneumothorax.  Chest tube. EXAM: PORTABLE CHEST 1 VIEW COMPARISON:  04/14/2019. FINDINGS: Left chest tube in stable position. Prominent left-sided pneumothorax with tension noted on today's exam. Atelectatic changes and pleural-parenchymal thickening consistent scarring again noted in the left lung. Left Intrabronchial valves again noted. COPD again noted. Tiny left pleural effusion. No acute bony abnormality identified. Prior cervicothoracic spine fusion. IMPRESSION: 1. Left chest tube noted in stable position. Prominent left-sided pneumothorax with tension noted on today's exam. Tiny left pleural effusion. 2.  COPD. Critical Value/emergent results were called by telephone at the time of interpretation on 04/15/2019 at 7:02 am to nurse Morene Rankins , who verbally acknowledged these results. Electronically Signed   By: Marcello Moores  Register   On: 04/15/2019 07:11    Scheduled Meds: . acyclovir ointment   Topical Q4H  .  enoxaparin (LOVENOX) injection  30 mg Subcutaneous Q24H  . feeding supplement (ENSURE ENLIVE)  237 mL Oral TID BM  . lidocaine  1 patch Transdermal Q24H  . megestrol  400 mg Oral Daily  . mirtazapine  15 mg Oral QHS  . senna-docusate  2 tablet Oral BID    Continuous Infusions: . lactated ringers Stopped (04/10/19 1224)     LOS: 9 days     Raechal Raben J [place] DO [facility] Pager [phone]  If 7PM-7AM, please contact night-coverage [email or web address] Password [email or web address] 04/15/2019, 1:26 PM

## 2019-04-15 NOTE — Progress Notes (Signed)
Received call from radiologist regarding chest xray on patient. Reported to have tension pneumothorax. PA Tacy Dura notified patient place to 20cm suction patient without complaint at this time.

## 2019-04-16 ENCOUNTER — Inpatient Hospital Stay (HOSPITAL_COMMUNITY): Payer: No Typology Code available for payment source

## 2019-04-16 DIAGNOSIS — E43 Unspecified severe protein-calorie malnutrition: Secondary | ICD-10-CM | POA: Insufficient documentation

## 2019-04-16 MED ORDER — GENERIC EXTERNAL MEDICATION
Status: DC
Start: ? — End: 2019-04-16

## 2019-04-16 MED ORDER — CEFAZOLIN SODIUM-DEXTROSE 2-4 GM/100ML-% IV SOLN
2.0000 g | INTRAVENOUS | Status: AC
  Administered 2019-04-17: 15:00:00 2 g via INTRAVENOUS
  Filled 2019-04-16: qty 100

## 2019-04-16 NOTE — Progress Notes (Addendum)
      [address]       [place] [address]             [phone]     6 Days Post-Op Procedure(s) (LRB): VIDEO BRONCHOSCOPY WITH INSERTION OF INTERBRONCHIAL VALVE (IBV) (N/A) Subjective: Feels okay this morning. Frustrated that his tube continues to kink.   Objective: Vital signs in last 24 hours: Temp:  [97.9 F (36.6 C)-98.3 F (36.8 C)] 98 F (36.7 C) (08/04 0731) Pulse Rate:  [76-90] 79 (08/04 0731) Cardiac Rhythm: Sinus tachycardia (08/03 1904) Resp:  [14-20] 20 (08/04 0731) BP: (119-144)/(87-95) 144/88 (08/04 0731) SpO2:  [96 %-99 %] 97 % (08/04 0731)     Intake/Output from previous day: 08/03 0701 - 08/04 0700 In: 840 [P.O.:840] Out: 1474 [Urine:1445; Stool:1; Chest Tube:28] Intake/Output this shift: No intake/output data recorded.  General appearance: alert, cooperative and no distress Heart: regular rate and rhythm, S1, S2 normal, no murmur, click, rub or gallop Lungs: clear to auscultation bilaterally Abdomen: soft, non-tender; bowel sounds normal; no masses,  no organomegaly Extremities: extremities normal, atraumatic, no cyanosis or edema Wound: clean and dry  Lab Results: No results for input(s): WBC, HGB, HCT, PLT in the last 72 hours. BMET: No results for input(s): NA, K, CL, CO2, GLUCOSE, BUN, CREATININE, CALCIUM in the last 72 hours.  PT/INR: No results for input(s): LABPROT, INR in the last 72 hours. ABG No results found for: PHART, HCO3, TCO2, ACIDBASEDEF, O2SAT CBG (last 3)  No results for input(s): GLUCAP in the last 72 hours.  Assessment/Plan: S/P Procedure(s) (LRB): VIDEO BRONCHOSCOPY WITH INSERTION OF INTERBRONCHIAL VALVE (IBV) (N/A)  1. CV- NSR in the 70s-80s. BP well controlled.  2. Pulm-Tolerating room air this morning with good oxygen saturation. CXR shows: Left chest tube in stable position. No definite pneumothorax noted on today's exam. Chest tube remains on 20cm of suction.   Plan: s/p intrabronchial valves x 4  for spontaneous pneumothorax and persistent air leak. 1+ air leak persists on 20cm of suction. It appears that pigtail continues to kink and this is extremely uncomfortable to the patient and he becomes short of breath. I taped the pigtail in a circle to the patient's chest wall to encourage the tubing not to kink. I reviewed options for the patient's persistent air leak and discussed that it will ultimately be up to the surgeon. Hopefully, this will resolve on its own without further therapies.    LOS: 10 days    Dillon Olson 04/16/2019 Pt seen and examined; films reviewed. As noted, he continues to have persistent air leak requiring suction to chest tube to keep Left lung fully expanded. Discussed options thoroughly with patient who agrees that left VATS for pleurodesis and possible bleb stapling is the best option for resolving this episode. Have discussed risks and benefits; will proceed tomorrow.   Ameri Cahoon Z. Orvan Seen, [place]

## 2019-04-16 NOTE — Progress Notes (Signed)
PROGRESS NOTE  Dillon Olson MWN:[id number] DOB: 1956/04/21 DOA: 04/06/2019 PCP: System, Pcp Not In  HPI/Recap of past 44 hours:  63 year old male reformed smoker with past medical history relevant for presumed left lung cancer not amenable to biopsy that was empirically treated with radiation therapy- previously undergone SBRT for presumed Stage 1A NSCLC in '17 (no biopsy). Admitted to [facility] in [employer] on 04/01/2019 with pneumothorax, status post chest tube placement at outside facility. Transferred from [facility] in [place] to [place] on 04/06/2019 with persistent air leak from the left chest tube requiring CT surgery consult    04/16/19: Patient was seen and examined at his bedside this morning.  Patient reports discomfort and shortness of breath when chest tube becomes "kinked".  Continues on low wall suction.  Chest x-ray this morning shows no pneumothorax and left chest tube in stable position.  Patient without any further complaints this morning.  Denies headache, no chest pain, no palpitations, no abdominal pain.  No acute events overnight per nursing staff.  Assessment/Plan: Principal Problem:   Pneumothorax on left Active Problems:   Solitary pulmonary nodule/presumed left lung cancer-status post prior radiation therapy   COPD with emphysema/Bleps   FTT (failure to thrive) in adult/left lung cancer related    Spontaneous pneumothorax  Patient initially presented to [facility] with pleuritic chest pain.  Found to have a pneumothorax in which a chest tube was placed.  [facility] to [facility] for cardiothoracic surgery evaluation.  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    COPD/emphysema w blebs Currently oxygenating well on room air. --Continue nebs as needed   DVT prophylaxis: Lovenox Code Status:Full Family Communication:None at bedside Disposition Plan:Per CT Surgery   Consultants:  CT surgery  Procedures:  CT placement  Chest tube replacement 7/28  Video bronchoscopy with insertion of intrabronchial valve 04/10/19     Objective: Vitals:   04/15/19 2055 04/16/19 0530 04/16/19 0700 04/16/19 0731  BP: (!) 119/95 127/87  (!) 144/88  Pulse:  76 82 79  Resp:  14 16 20   Temp: 98 F (36.7 C) 97.9 F (36.6 C)  98 F (36.7 C)  TempSrc: Oral Oral  Oral  SpO2: 99% 98% 97% 97%  Weight:      Height:        Intake/Output Summary (Last 24 hours) at 04/16/2019 1314 Last data filed at 04/16/2019 1038 Gross per 24 hour  Intake 600 ml  Output 1774 ml  Net -1174 ml   Filed Weights   04/13/19 1659  Weight: 43.8 kg    Exam:  . General: 63 y.o. year-old male Emaciated in no acute distress.  Alert and oriented x3.   . Cardiovascular: Regular rate and rhythm no rubs or gallops.  No JVD or thyromegaly noted.   Marland Kitchen Respiratory: Clear to auscultation no wheezes or rales.  Poor inspiratory effort.  Left chest tube in place; now placed; on low wall suction . Abdomen: Soft nontender nondistended normal bowel sounds  present. . Musculoskeletal: No lower extremity edema.  2 out of 4 pulses in all 4 extremities. Marland Kitchen Psychiatry: Mood is appropriate for condition and setting.  Data Reviewed: CBC: Recent Labs  Lab 04/10/19 0358  WBC 8.1  HGB 13.3  HCT 39.4  MCV 94.9  PLT 195   Basic Metabolic Panel: Recent Labs  Lab 04/10/19 0358 04/11/19 0300 04/13/19 0254  NA  137 138  --   K 4.2 3.9  --   CL 106 107  --   CO2 24 23  --   GLUCOSE 103* 105*  --   BUN 10 11  --   CREATININE 0.85 0.79 0.76  CALCIUM 9.3 9.0  --    GFR: Estimated Creatinine Clearance: 59.3 mL/min (by C-G formula based on SCr of 0.76 mg/dL). Liver Function Tests: No results for input(s): AST, ALT, ALKPHOS, BILITOT, PROT, ALBUMIN in the last 168 hours. No results for input(s): LIPASE, AMYLASE in the last 168 hours. No results for input(s): AMMONIA in the last 168 hours. Coagulation Profile: No results for input(s): INR, PROTIME in the last 168 hours. Cardiac Enzymes: No results for input(s): CKTOTAL, CKMB, CKMBINDEX, TROPONINI in the last 168 hours. BNP (last 3 results) No results for input(s): PROBNP in the last 8760 hours. HbA1C: No results for input(s): HGBA1C in the last 72 hours. CBG: No results for input(s): GLUCAP in the last 168 hours. Lipid Profile: No results for input(s): CHOL, HDL, LDLCALC, TRIG, CHOLHDL, LDLDIRECT in the last 72 hours. Thyroid Function Tests: No results for input(s): TSH, T4TOTAL, FREET4, T3FREE, THYROIDAB in the last 72 hours. Anemia Panel: No results for input(s): VITAMINB12, FOLATE, FERRITIN, TIBC, IRON, RETICCTPCT in the last 72 hours. Urine analysis:    Component Value Date/Time   COLORURINE YELLOW 04/06/2019 2020   APPEARANCEUR CLEAR 04/06/2019 2020   LABSPEC 1.015 04/06/2019 2020   PHURINE 6.0 04/06/2019 2020   GLUCOSEU NEGATIVE 04/06/2019 2020   HGBUR NEGATIVE 04/06/2019 2020   BILIRUBINUR NEGATIVE 04/06/2019 2020   KETONESUR NEGATIVE 04/06/2019 2020   PROTEINUR NEGATIVE 04/06/2019 2020   NITRITE NEGATIVE 04/06/2019 2020   LEUKOCYTESUR NEGATIVE 04/06/2019 2020   Sepsis Labs: @LABRCNTIP (procalcitonin:4,lacticidven:4)  ) Recent Results (from the past 240 hour(s))  SARS Coronavirus 2 (CEPHEID - Performed in [place] hospital lab), Hosp Order     Status: None   Collection Time: 04/07/19 10:30 AM   Specimen: Nasopharyngeal  Swab  Result Value Ref Range Status   SARS Coronavirus 2 NEGATIVE NEGATIVE Final    Comment: (NOTE) If result is NEGATIVE SARS-CoV-2 target nucleic acids are NOT DETECTED. The SARS-CoV-2 RNA is generally detectable in upper and lower  respiratory specimens during the acute phase of infection. The lowest  concentration of SARS-CoV-2 viral copies this assay can detect is 250  copies / mL. A negative result does not preclude SARS-CoV-2 infection  and should not be used as the sole basis for treatment or other  patient management decisions.  A negative result may occur with  improper specimen collection / handling, submission of specimen other  than nasopharyngeal swab, presence of viral mutation(s) within the  areas targeted by this assay, and inadequate number of viral copies  (<250 copies / mL). A negative result must be combined with clinical  observations, patient history, and epidemiological information. If result is POSITIVE SARS-CoV-2 target nucleic acids are DETECTED. The SARS-CoV-2 RNA is generally detectable in upper and lower  respiratory specimens dur ing the acute phase of infection.  Positive  results are  indicative of active infection with SARS-CoV-2.  Clinical  correlation with patient history and other diagnostic information is  necessary to determine patient infection status.  Positive results do  not rule out bacterial infection or co-infection with other viruses. If result is PRESUMPTIVE POSTIVE SARS-CoV-2 nucleic acids MAY BE PRESENT.   A presumptive positive result was obtained on the submitted specimen  and confirmed on repeat testing.  While 2019 novel coronavirus  (SARS-CoV-2) nucleic acids may be present in the submitted sample  additional confirmatory testing may be necessary for epidemiological  and / or clinical management purposes  to differentiate between  SARS-CoV-2 and other Sarbecovirus currently known to infect humans.  If clinically indicated  additional testing with an alternate test  methodology [phone]) is advised. The SARS-CoV-2 RNA is generally  detectable in upper and lower respiratory sp ecimens during the acute  phase of infection. The expected result is Negative. Fact Sheet for Patients:  StrictlyIdeas.no Fact Sheet for Healthcare Providers: BankingDealers.co.za This test is not yet approved or cleared by the [place] FDA and has been authorized for detection and/or diagnosis of SARS-CoV-2 by FDA under an Emergency Use Authorization (EUA).  This EUA will remain in effect (meaning this test can be used) for the duration of the COVID-19 declaration under Section 564(b)(1) of the Act, 21 U.S.C. section 360bbb-3(b)(1), unless the authorization is terminated or revoked sooner. Performed at [facility], [place] [address], [place], [place] [address]       Studies: Dg Chest Port 1 View  Result Date: 04/16/2019 CLINICAL DATA:  Pneumothorax.  Chest tube. EXAM: PORTABLE CHEST 1 VIEW COMPARISON:  04/15/2019. FINDINGS: Left chest tube noted in stable position. No definite pneumothorax noted on today's exam. Left apical pleural-parenchymal thickening again noted consistent with scarring. COPD. Endobronchial valves again noted in stable position. No pleural effusion. Heart size normal. IMPRESSION: 1. Left chest tube in stable position. No definite pneumothorax noted on today's exam. 2. Left apical pleural-parenchymal thickening again noted consistent scarring. COPD. Electronically Signed   By: Marcello Moores  Register   On: 04/16/2019 06:44   Dg Chest Port 1 View  Result Date: 04/15/2019 CLINICAL DATA:  Left pneumothorax with persistent air leak. Suction reapplied. EXAM: PORTABLE CHEST 1 VIEW COMPARISON:  Numerous priors, most recently same day radiograph FINDINGS: Extensive scarring and architectural distortion in the left lung likely related to prior VATS procedure. Multiple  endobronchial valves are noted in the left mid lung. Decrease in the size of the left pneumothorax with small to moderate residual apical component with left chest tube is in position. Hazy bibasilar opacity likely reflect areas of atelectatic change. Cardiomediastinal contours are normal. Cervical hardware is unchanged from prior. Remaining osseous structures and soft tissues are unremarkable. IMPRESSION: Diminished size of the left pneumothorax with residual small to moderate atypical component. Electronically Signed   By: Lovena Le M.D.   On: 04/15/2019 14:57    Scheduled Meds: . acyclovir ointment   Topical Q4H  . enoxaparin (LOVENOX) injection  30 mg Subcutaneous Q24H  . feeding supplement (ENSURE ENLIVE)  237 mL Oral TID BM  . lidocaine  1 patch Transdermal Q24H  . megestrol  400 mg Oral Daily  . mirtazapine  15 mg Oral QHS  . senna-docusate  2 tablet Oral BID    Continuous Infusions: . lactated ringers Stopped (04/10/19 1224)     LOS: 10 days     Dillon J [place] DO [facility] Pager [phone]  If 7PM-7AM, please contact night-coverage  [email or web address] Password [email or web address] 04/16/2019, 1:14 PM

## 2019-04-16 NOTE — Progress Notes (Signed)
Nutrition Follow-up  DOCUMENTATION CODES:   Severe malnutrition in context of chronic illness  INTERVENTION:    Continue Ensure Enlive po BID, each supplement provides 350 kcal and 20 grams of protein  NUTRITION DIAGNOSIS:   Severe Malnutrition related to chronic illness, cancer and cancer related treatments as evidenced by severe fat depletion, severe muscle depletion, percent weight loss, energy intake < or equal to 75% for > or equal to 1 month.  Ongoing  GOAL:   Patient will meet greater than or equal to 90% of their needs  Progressing  MONITOR:   PO intake, Supplement acceptance, Weight trends, Labs, I & O's  REASON FOR ASSESSMENT:   Consult Assessment of nutrition requirement/status  ASSESSMENT:   Patient with PMH significant for L left lung cancer s/p radiation. Recently admitted at [facility] with L spontaneous pneumothorax s/p chest tube. Transferred to [facility] for further evaluation.   7/28- chest tube replaced  Pt reports appetite picks up daily with megace. Had some complaints regarding hospital food. Meal completions charted as 50-100% for pt's last eight meals. Drinks Ensure BID. Discussed the importance of protein intake for preservation of lean body mass. Pt willing to continue with Ensure BID but explains he cant tolerate more as it makes him constipated.   PTA- pt was consuming 1 meal daily that consisted of meats with vegetables. He used to drink the Ensure provided from the [place] but stopped once he started gaining weight. Pt reports a UBW of 188 lb at the beginning of June and a 20 lb wt loss since.   A recent weight has not been obtained since admit.   Medications: megace, senokot Labs: CBG 103-105  NUTRITION - FOCUSED PHYSICAL EXAM:    Most Recent Value  Orbital Region  Mild depletion  Upper Arm Region  Severe depletion  Thoracic and Lumbar Region  Unable to assess  Buccal Region  Severe depletion  Temple Region  Severe depletion  Clavicle  Bone Region  Severe depletion  Clavicle and Acromion Bone Region  Severe depletion  Scapular Bone Region  Unable to assess  Dorsal Hand  Moderate depletion  Patellar Region  Severe depletion  Anterior Thigh Region  Severe depletion  Posterior Calf Region  Severe depletion  Edema (RD Assessment)  None  Hair  Reviewed  Eyes  Reviewed  Mouth  Reviewed  Skin  Reviewed  Nails  Reviewed     Diet Order:   Diet Order            Diet regular Room service appropriate? Yes; Fluid consistency: Thin  Diet effective now              EDUCATION NEEDS:   Not appropriate for education at this time  Skin:  Skin Assessment: Skin Integrity Issues: Skin Integrity Issues:: Incisions Incisions: L chest tube  Last BM:  8/3  Height:   Ht Readings from Last 1 Encounters:  04/13/19 5\' 5"  (1.651 m)    Weight:   Wt Readings from Last 1 Encounters:  04/13/19 43.8 kg    Ideal Body Weight:  61.8 kg  BMI:  Body mass index is 16.08 kg/m.  Estimated Nutritional Needs:   Kcal:  1800-2000 kcal  Protein:  90-105 grams  Fluid:  >/= 1.8 L/day   Mariana Single RD, LDN Clinical Nutrition Pager # - [phone]

## 2019-04-17 ENCOUNTER — Inpatient Hospital Stay (HOSPITAL_COMMUNITY): Payer: No Typology Code available for payment source

## 2019-04-17 ENCOUNTER — Inpatient Hospital Stay (HOSPITAL_COMMUNITY): Payer: No Typology Code available for payment source | Admitting: Certified Registered Nurse Anesthetist

## 2019-04-17 ENCOUNTER — Encounter (HOSPITAL_COMMUNITY): Payer: Self-pay | Admitting: *Deleted

## 2019-04-17 ENCOUNTER — Encounter (HOSPITAL_COMMUNITY)
Admission: AD | Disposition: A | Discharge status: Home / Self Care | Payer: Self-pay | Source: Other Acute Inpatient Hospital | Attending: Cardiothoracic Surgery

## 2019-04-17 DIAGNOSIS — J9311 Primary spontaneous pneumothorax: Secondary | ICD-10-CM

## 2019-04-17 HISTORY — PX: VIDEO ASSISTED THORACOSCOPY (VATS) W/TALC PLEUADESIS: SHX6168

## 2019-04-17 HISTORY — PX: WEDGE RESECTION: SHX5070

## 2019-04-17 LAB — CBC
HCT: 39.8 % (ref 39.0–52.0)
Hemoglobin: 13.8 g/dL (ref 13.0–17.0)
MCH: 32.8 pg (ref 26.0–34.0)
MCHC: 34.7 g/dL (ref 30.0–36.0)
MCV: 94.5 fL (ref 80.0–100.0)
Platelets: 481 10*3/uL — ABNORMAL HIGH (ref 150–400)
RBC: 4.21 MIL/uL — ABNORMAL LOW (ref 4.22–5.81)
RDW: 13.9 % (ref 11.5–15.5)
WBC: 12 10*3/uL — ABNORMAL HIGH (ref 4.0–10.5)
nRBC: 0 % (ref 0.0–0.2)

## 2019-04-17 LAB — COMPREHENSIVE METABOLIC PANEL
ALT: 50 U/L — ABNORMAL HIGH (ref 0–44)
AST: 28 U/L (ref 15–41)
Albumin: 3.2 g/dL — ABNORMAL LOW (ref 3.5–5.0)
Alkaline Phosphatase: 98 U/L (ref 38–126)
Anion gap: 15 (ref 5–15)
BUN: 19 mg/dL (ref 8–23)
CO2: 21 mmol/L — ABNORMAL LOW (ref 22–32)
Calcium: 9.4 mg/dL (ref 8.9–10.3)
Chloride: 101 mmol/L (ref 98–111)
Creatinine, Ser: 0.73 mg/dL (ref 0.61–1.24)
GFR calc Af Amer: >60 mL/min (ref >60)
GFR calc non Af Amer: >60 mL/min (ref >60)
Glucose, Bld: 87 mg/dL (ref 70–99)
Potassium: 3.8 mmol/L (ref 3.5–5.1)
Sodium: 137 mmol/L (ref 135–145)
Total Bilirubin: 0.3 mg/dL (ref 0.3–1.2)
Total Protein: 6 g/dL — ABNORMAL LOW (ref 6.5–8.1)

## 2019-04-17 LAB — SURGICAL PCR SCREEN
MRSA, PCR: NEGATIVE
Staphylococcus aureus: NEGATIVE

## 2019-04-17 LAB — URINALYSIS, ROUTINE W REFLEX MICROSCOPIC
Bilirubin Urine: NEGATIVE
Glucose, UA: NEGATIVE mg/dL
Hgb urine dipstick: NEGATIVE
Ketones, ur: NEGATIVE mg/dL
Leukocytes,Ua: NEGATIVE
Nitrite: NEGATIVE
Protein, ur: NEGATIVE mg/dL
Specific Gravity, Urine: 1.011 (ref 1.005–1.030)
pH: 7 (ref 5.0–8.0)

## 2019-04-17 LAB — PROTIME-INR
INR: 1.1 (ref 0.8–1.2)
Prothrombin Time: 13.8 seconds (ref 11.4–15.2)

## 2019-04-17 LAB — APTT: aPTT: 27 seconds (ref 24–36)

## 2019-04-17 LAB — TYPE AND SCREEN
ABO/RH(D): O NEG
Antibody Screen: NEGATIVE

## 2019-04-17 LAB — ABO/RH: ABO/RH(D): O NEG

## 2019-04-17 SURGERY — VIDEO ASSISTED THORACOSCOPY (VATS) W/TALC PLEUADESIS
Anesthesia: General | Site: Chest | Laterality: Left

## 2019-04-17 MED ORDER — FENTANYL CITRATE (PF) 250 MCG/5ML IJ SOLN
INTRAMUSCULAR | Status: DC | PRN
  Administered 2019-04-17 (×2): 50 ug via INTRAVENOUS
  Administered 2019-04-17: 100 ug via INTRAVENOUS
  Administered 2019-04-17 (×2): 50 ug via INTRAVENOUS
  Administered 2019-04-17 (×2): 100 ug via INTRAVENOUS

## 2019-04-17 MED ORDER — PROPOFOL 10 MG/ML IV BOLUS
INTRAVENOUS | Status: AC
  Filled 2019-04-17: qty 20

## 2019-04-17 MED ORDER — FENTANYL CITRATE (PF) 100 MCG/2ML IJ SOLN
INTRAMUSCULAR | Status: AC
  Filled 2019-04-17: qty 2

## 2019-04-17 MED ORDER — TALC 5 G PL SUSR
INTRAPLEURAL | Status: DC | PRN
  Administered 2019-04-17: 4 g via INTRAPLEURAL

## 2019-04-17 MED ORDER — FENTANYL CITRATE (PF) 100 MCG/2ML IJ SOLN
25.0000 ug | INTRAMUSCULAR | Status: DC | PRN
  Administered 2019-04-17 (×2): 50 ug via INTRAVENOUS

## 2019-04-17 MED ORDER — GENERIC EXTERNAL MEDICATION
Status: DC
Start: ? — End: 2019-04-17

## 2019-04-17 MED ORDER — DEXTROSE-NACL 5-0.9 % IV SOLN
INTRAVENOUS | Status: DC
  Administered 2019-04-17 – 2019-04-19 (×2): via INTRAVENOUS

## 2019-04-17 MED ORDER — PROPOFOL 10 MG/ML IV BOLUS
INTRAVENOUS | Status: DC | PRN
  Administered 2019-04-17: 25 mg via INTRAVENOUS
  Administered 2019-04-17: 150 mg via INTRAVENOUS
  Administered 2019-04-17: 25 mg via INTRAVENOUS

## 2019-04-17 MED ORDER — DEXAMETHASONE SODIUM PHOSPHATE 10 MG/ML IJ SOLN
INTRAMUSCULAR | Status: DC | PRN
  Administered 2019-04-17: 10 mg via INTRAVENOUS

## 2019-04-17 MED ORDER — LIDOCAINE 2% (20 MG/ML) 5 ML SYRINGE
INTRAMUSCULAR | Status: AC
  Filled 2019-04-17: qty 5

## 2019-04-17 MED ORDER — ONDANSETRON HCL 4 MG/2ML IJ SOLN: 4.0000 mg | Freq: Four times a day (QID) | INTRAMUSCULAR | Status: DC | PRN

## 2019-04-17 MED ORDER — BUPIVACAINE HCL 0.5 % IJ SOLN: INTRAMUSCULAR | Status: DC | PRN

## 2019-04-17 MED ORDER — PHENYLEPHRINE 40 MCG/ML (10ML) SYRINGE FOR IV PUSH (FOR BLOOD PRESSURE SUPPORT)
PREFILLED_SYRINGE | INTRAVENOUS | Status: AC
  Filled 2019-04-17: qty 10

## 2019-04-17 MED ORDER — SUCCINYLCHOLINE CHLORIDE 200 MG/10ML IV SOSY
PREFILLED_SYRINGE | INTRAVENOUS | Status: AC
  Filled 2019-04-17: qty 10

## 2019-04-17 MED ORDER — ONDANSETRON HCL 4 MG/2ML IJ SOLN
INTRAMUSCULAR | Status: DC | PRN
  Administered 2019-04-17: 4 mg via INTRAVENOUS

## 2019-04-17 MED ORDER — TALC (STERITALC) POWDER FOR INTRAPLEURAL USE
INTRAPLEURAL | Status: AC
  Filled 2019-04-17: qty 4

## 2019-04-17 MED ORDER — EPHEDRINE 5 MG/ML INJ
INTRAVENOUS | Status: AC
  Filled 2019-04-17: qty 10

## 2019-04-17 MED ORDER — ACETAMINOPHEN 160 MG/5ML PO SOLN: 325.0000 mg | Freq: Once | ORAL | Status: DC | PRN

## 2019-04-17 MED ORDER — CEFAZOLIN SODIUM-DEXTROSE 2-4 GM/100ML-% IV SOLN
2.0000 g | Freq: Three times a day (TID) | INTRAVENOUS | Status: AC
  Administered 2019-04-17 – 2019-04-18 (×2): 2 g via INTRAVENOUS
  Filled 2019-04-17 (×2): qty 100

## 2019-04-17 MED ORDER — KETOROLAC TROMETHAMINE 15 MG/ML IJ SOLN
15.0000 mg | Freq: Three times a day (TID) | INTRAMUSCULAR | Status: AC
  Administered 2019-04-17 – 2019-04-19 (×5): 15 mg via INTRAVENOUS
  Filled 2019-04-17 (×5): qty 1

## 2019-04-17 MED ORDER — FENTANYL CITRATE (PF) 250 MCG/5ML IJ SOLN
INTRAMUSCULAR | Status: AC
  Filled 2019-04-17: qty 5

## 2019-04-17 MED ORDER — LIDOCAINE 2% (20 MG/ML) 5 ML SYRINGE
INTRAMUSCULAR | Status: DC | PRN
  Administered 2019-04-17: 40 mg via INTRAVENOUS

## 2019-04-17 MED ORDER — PROMETHAZINE HCL 25 MG/ML IJ SOLN: 6.2500 mg | INTRAMUSCULAR | Status: DC | PRN

## 2019-04-17 MED ORDER — ACETAMINOPHEN 10 MG/ML IV SOLN
650.0000 mg | Freq: Once | INTRAVENOUS | Status: DC | PRN
  Filled 2019-04-17: qty 65

## 2019-04-17 MED ORDER — FENTANYL CITRATE (PF) 100 MCG/2ML IJ SOLN
25.0000 ug | INTRAMUSCULAR | Status: DC | PRN
  Administered 2019-04-17 – 2019-04-21 (×39): 25 ug via INTRAVENOUS
  Filled 2019-04-17 (×39): qty 2

## 2019-04-17 MED ORDER — MIDAZOLAM HCL 2 MG/2ML IJ SOLN
INTRAMUSCULAR | Status: AC
  Filled 2019-04-17: qty 2

## 2019-04-17 MED ORDER — DEXAMETHASONE SODIUM PHOSPHATE 10 MG/ML IJ SOLN
INTRAMUSCULAR | Status: AC
  Filled 2019-04-17: qty 2

## 2019-04-17 MED ORDER — ONDANSETRON HCL 4 MG/2ML IJ SOLN
INTRAMUSCULAR | Status: AC
  Filled 2019-04-17: qty 2

## 2019-04-17 MED ORDER — LACTATED RINGERS IV SOLN: INTRAVENOUS | Status: DC

## 2019-04-17 MED ORDER — BUPIVACAINE LIPOSOME 1.3 % IJ SUSP
INTRAMUSCULAR | Status: DC | PRN
  Administered 2019-04-17: 20 mL

## 2019-04-17 MED ORDER — BUPIVACAINE LIPOSOME 1.3 % IJ SUSP
20.0000 mL | Freq: Once | INTRAMUSCULAR | Status: DC
  Filled 2019-04-17 (×2): qty 20

## 2019-04-17 MED ORDER — ALBUTEROL SULFATE (2.5 MG/3ML) 0.083% IN NEBU
2.5000 mg | INHALATION_SOLUTION | Freq: Four times a day (QID) | RESPIRATORY_TRACT | Status: DC | PRN
  Administered 2019-04-17: 2.5 mg via RESPIRATORY_TRACT

## 2019-04-17 MED ORDER — SUCCINYLCHOLINE CHLORIDE 200 MG/10ML IV SOSY
PREFILLED_SYRINGE | INTRAVENOUS | Status: DC | PRN
  Administered 2019-04-17: 100 mg via INTRAVENOUS

## 2019-04-17 MED ORDER — MEPERIDINE HCL 25 MG/ML IJ SOLN: 6.2500 mg | INTRAMUSCULAR | Status: DC | PRN

## 2019-04-17 MED ORDER — ACETAMINOPHEN 325 MG PO TABS: 325.0000 mg | ORAL_TABLET | Freq: Once | ORAL | Status: DC | PRN

## 2019-04-17 MED ORDER — MIDAZOLAM HCL 5 MG/5ML IJ SOLN
INTRAMUSCULAR | Status: DC | PRN
  Administered 2019-04-17: 2 mg via INTRAVENOUS

## 2019-04-17 MED ORDER — BUPIVACAINE HCL (PF) 0.5 % IJ SOLN
INTRAMUSCULAR | Status: AC
  Filled 2019-04-17: qty 30

## 2019-04-17 MED ORDER — ALBUTEROL SULFATE (2.5 MG/3ML) 0.083% IN NEBU
INHALATION_SOLUTION | RESPIRATORY_TRACT | Status: AC
  Filled 2019-04-17: qty 3

## 2019-04-17 MED ORDER — ACETAMINOPHEN 160 MG/5ML PO SOLN: 1000.0000 mg | Freq: Four times a day (QID) | ORAL | Status: DC

## 2019-04-17 MED ORDER — ROCURONIUM BROMIDE 10 MG/ML (PF) SYRINGE
PREFILLED_SYRINGE | INTRAVENOUS | Status: AC
  Filled 2019-04-17: qty 10

## 2019-04-17 MED ORDER — ACETAMINOPHEN 500 MG PO TABS
1000.0000 mg | ORAL_TABLET | Freq: Four times a day (QID) | ORAL | Status: DC
  Administered 2019-04-21 – 2019-04-22 (×2): 1000 mg via ORAL
  Filled 2019-04-17 (×6): qty 2

## 2019-04-17 MED ORDER — BISACODYL 5 MG PO TBEC
10.0000 mg | DELAYED_RELEASE_TABLET | Freq: Every day | ORAL | Status: DC
  Filled 2019-04-17 (×2): qty 2

## 2019-04-17 MED ORDER — ROCURONIUM BROMIDE 50 MG/5ML IV SOSY
PREFILLED_SYRINGE | INTRAVENOUS | Status: DC | PRN
  Administered 2019-04-17 (×2): 10 mg via INTRAVENOUS
  Administered 2019-04-17: 40 mg via INTRAVENOUS

## 2019-04-17 MED ORDER — 0.9 % SODIUM CHLORIDE (POUR BTL) OPTIME
TOPICAL | Status: DC | PRN
  Administered 2019-04-17: 15:00:00 2000 mL

## 2019-04-17 SURGICAL SUPPLY — 92 items
APPLICATOR TIP EXT COSEAL (VASCULAR PRODUCTS) IMPLANT
CANISTER SUCT 3000ML PPV (MISCELLANEOUS) ×3 IMPLANT
CATH KIT ON-Q SILVERSOAK 5IN (CATHETERS) IMPLANT
CATH ROBINSON RED A/P 18FR (CATHETERS) ×3 IMPLANT
CATH THORACIC 20FR (CATHETERS) ×3 IMPLANT
CATH THORACIC 28FR (CATHETERS) IMPLANT
CATH THORACIC 28FR RT ANG (CATHETERS) IMPLANT
CATH THORACIC 36FR (CATHETERS) IMPLANT
CATH THORACIC 36FR RT ANG (CATHETERS) IMPLANT
CLIP VESOCCLUDE MED 6/CT (CLIP) ×3 IMPLANT
CONN ST 1/4X3/8  BEN (MISCELLANEOUS) ×1
CONN ST 1/4X3/8 BEN (MISCELLANEOUS) ×2 IMPLANT
CONN Y 3/8X3/8X3/8  BEN (MISCELLANEOUS)
CONN Y 3/8X3/8X3/8 BEN (MISCELLANEOUS) IMPLANT
CONT SPEC 4OZ CLIKSEAL STRL BL (MISCELLANEOUS) ×12 IMPLANT
COVER SURGICAL LIGHT HANDLE (MISCELLANEOUS) ×3 IMPLANT
COVER WAND RF STERILE (DRAPES) ×3 IMPLANT
DECANTER SPIKE VIAL GLASS SM (MISCELLANEOUS) ×3 IMPLANT
DERMABOND ADHESIVE PROPEN (GAUZE/BANDAGES/DRESSINGS) ×1
DERMABOND ADVANCED (GAUZE/BANDAGES/DRESSINGS) ×1
DERMABOND ADVANCED .7 DNX12 (GAUZE/BANDAGES/DRESSINGS) ×2 IMPLANT
DERMABOND ADVANCED .7 DNX6 (GAUZE/BANDAGES/DRESSINGS) ×2 IMPLANT
DRAIN CHANNEL 28F RND 3/8 FF (WOUND CARE) IMPLANT
DRAIN CHANNEL 32F RND 10.7 FF (WOUND CARE) IMPLANT
DRAPE CV SPLIT W-CLR ANES SCRN (DRAPES) ×3 IMPLANT
DRAPE ORTHO SPLIT 77X108 STRL (DRAPES) ×1
DRAPE SURG ORHT 6 SPLT 77X108 (DRAPES) ×2 IMPLANT
DRAPE WARM FLUID 44X44 (DRAPES) ×3 IMPLANT
ELECT BLADE 6.5 EXT (BLADE) ×3 IMPLANT
ELECT REM PT RETURN 9FT ADLT (ELECTROSURGICAL) ×3
ELECTRODE REM PT RTRN 9FT ADLT (ELECTROSURGICAL) ×2 IMPLANT
GAUZE SPONGE 4X4 12PLY STRL (GAUZE/BANDAGES/DRESSINGS) ×3 IMPLANT
GAUZE SPONGE 4X4 12PLY STRL LF (GAUZE/BANDAGES/DRESSINGS) ×3 IMPLANT
GLOVE BIOGEL PI IND STRL 7.5 (GLOVE) ×4 IMPLANT
GLOVE BIOGEL PI INDICATOR 7.5 (GLOVE) ×2
GLOVE SURG SIGNA 7.5 PF LTX (GLOVE) ×6 IMPLANT
GOWN STRL REUS W/ TWL LRG LVL3 (GOWN DISPOSABLE) ×8 IMPLANT
GOWN STRL REUS W/ TWL XL LVL3 (GOWN DISPOSABLE) ×2 IMPLANT
GOWN STRL REUS W/TWL LRG LVL3 (GOWN DISPOSABLE) ×4
GOWN STRL REUS W/TWL XL LVL3 (GOWN DISPOSABLE) ×1
HANDLE STAPLE ENDO GIA SHORT (STAPLE) ×1
HEMOSTAT SURGICEL 2X14 (HEMOSTASIS) IMPLANT
KIT BASIN OR (CUSTOM PROCEDURE TRAY) ×3 IMPLANT
KIT TURNOVER KIT B (KITS) ×3 IMPLANT
NEEDLE HYPO 25GX1X1/2 BEV (NEEDLE) ×6 IMPLANT
NEEDLE SPNL 18GX3.5 QUINCKE PK (NEEDLE) IMPLANT
NS IRRIG 1000ML POUR BTL (IV SOLUTION) ×9 IMPLANT
PACK CHEST (CUSTOM PROCEDURE TRAY) ×3 IMPLANT
PAD ARMBOARD 7.5X6 YLW CONV (MISCELLANEOUS) ×6 IMPLANT
POUCH ENDO CATCH II 15MM (MISCELLANEOUS) IMPLANT
POUCH SPECIMEN RETRIEVAL 10MM (ENDOMECHANICALS) IMPLANT
PROGEL SPRAY TIP 11IN (MISCELLANEOUS) ×3
RELOAD TRI 45 ART MED THCK PUR (STAPLE) ×6 IMPLANT
RELOAD TRI 60 ART MED THCK PUR (STAPLE) ×9 IMPLANT
SEALANT PROGEL (MISCELLANEOUS) ×3 IMPLANT
SEALANT SURG COSEAL 8ML (VASCULAR PRODUCTS) IMPLANT
SOLUTION ANTI FOG 6CC (MISCELLANEOUS) ×3 IMPLANT
SPONGE TONSIL TAPE 1 RFD (DISPOSABLE) ×6 IMPLANT
STAPLER ENDO GIA 12MM SHORT (STAPLE) ×2 IMPLANT
SUT MNCRL AB 4-0 PS2 18 (SUTURE) ×3 IMPLANT
SUT PROLENE 4 0 RB 1 (SUTURE)
SUT PROLENE 4-0 RB1 .5 CRCL 36 (SUTURE) IMPLANT
SUT SILK  1 MH (SUTURE) ×2
SUT SILK 1 MH (SUTURE) ×4 IMPLANT
SUT SILK 1 TIES 10X30 (SUTURE) ×3 IMPLANT
SUT SILK 2 0SH CR/8 30 (SUTURE) IMPLANT
SUT SILK 3 0SH CR/8 30 (SUTURE) IMPLANT
SUT VIC AB 0 CT1 27 (SUTURE) ×1
SUT VIC AB 0 CT1 27XBRD ANBCTR (SUTURE) ×2 IMPLANT
SUT VIC AB 1 CTX 36 (SUTURE)
SUT VIC AB 1 CTX36XBRD ANBCTR (SUTURE) IMPLANT
SUT VIC AB 2-0 CTX 36 (SUTURE) IMPLANT
SUT VIC AB 3-0 MH 27 (SUTURE) IMPLANT
SUT VIC AB 3-0 X1 27 (SUTURE) ×3 IMPLANT
SUT VICRYL 2 TP 1 (SUTURE) IMPLANT
SYR 10ML LL (SYRINGE) IMPLANT
SYR 30ML LL (SYRINGE) ×3 IMPLANT
SYR BULB 3OZ (MISCELLANEOUS) ×3 IMPLANT
SYR BULB IRRIGATION 50ML (SYRINGE) ×3 IMPLANT
SYR CONTROL 10ML LL (SYRINGE) ×3 IMPLANT
SYSTEM SAHARA CHEST DRAIN ATS (WOUND CARE) ×3 IMPLANT
TAPE CLOTH 4X10 WHT NS (GAUZE/BANDAGES/DRESSINGS) ×3 IMPLANT
TAPE CLOTH SURG 4X10 WHT LF (GAUZE/BANDAGES/DRESSINGS) ×3 IMPLANT
TIP APPLICATOR SPRAY EXTEND 16 (VASCULAR PRODUCTS) IMPLANT
TIP SPRAY PROGEL 11IN (MISCELLANEOUS) ×2 IMPLANT
TOWEL GREEN STERILE (TOWEL DISPOSABLE) ×3 IMPLANT
TOWEL GREEN STERILE FF (TOWEL DISPOSABLE) ×3 IMPLANT
TRAY FOLEY MTR SLVR 16FR STAT (SET/KITS/TRAYS/PACK) ×3 IMPLANT
TROCAR XCEL BLADELESS 5X75MML (TROCAR) IMPLANT
TUBE SUCT ARGYLE STRL (TUBING) ×3 IMPLANT
TUNNELER SHEATH ON-Q 11GX8 DSP (PAIN MANAGEMENT) IMPLANT
WATER STERILE IRR 1000ML POUR (IV SOLUTION) ×6 IMPLANT

## 2019-04-17 NOTE — Op Note (Signed)
Procedure(s): Video Assisted Thoracoscopy (Vats) W Talc Pleuradesis Wedge Resection of Left Upper Lobe Procedure Note  Dillon Olson male 63 y.o. 04/17/2019  Procedure(s) and Anesthesia Type:    * Video Assisted Thoracoscopy (Vats) W Talc Pleuradesis - General    * Wedge Resection of Left Upper Lobe - General Extensive pneumo-adhesiolysis (30 minutes) Multi-level rib block with Experel liposomal bupivicaine  Surgeon(s) and Role:    Wonda Olds, MD - Primary   Indications: The patient was admitted to the hospital with left spontaneous PTX; failed to heal air leak with conservative measures including bronchial valve placement; unable to consistently wean chest tube off suction. Decision was made to perform thoracoscopic exploration for pleurodesis. The patient now presents for this procedure after discussing therapeutic alternatives.        Surgeon: Wonda Olds   Assistants: Jadene Pierini PA-C  Anesthesia: General endotracheal - Double lumen tube  ASA Class: 3    Procedure Detail  Video Assisted Thoracoscopy (Vats) W Talc Pleuradesis, Wedge Resection of Left Upper Lobe  After informed consent, he was brought to the OR at [facility] on 04/17/19. Anesthesia was begun in a smooth fashion by GET with lung isolation. He was placed in right lateral decubitus position. The left chest was cleansed and draped sterilely. A pre-procedure pause was performed.   A VATS port incision was made in the anterior axillary line, 7th ICS. The VATS camera was inserted and the left pleural space inspected. There were several areas of adhesion of the left lung to chest wall, but especially and most densely at the antero-apical left chest. A counter incision was made in the mid clavicular line, 5th ICS, measuring approximately 4 cm in length. This was the working port through which extensive pneumo adhesiolysis was performed with electrocautery to mobilize the left lung (30 minutes). This  was necessary in order to visualize and treat the known bleb disease in the upper lobe. Once the left upper lobe was mobilized from the chest wall, a generous LUL wedge resection was performed with several reinforced staple firings. The specimen was brought through the working port and passed off the field. The long staple line was treated with surgical glue/adhesive. Mechanical followed by talc pleurodesis was then performed. Multilevel rib block with Experel liposomal bupivicaine was perfomed. A 20-Fr chest tube was inserted in the left chest through the camera incision and secured at the skin level. Both lungs were then ventilated. The working port incision was repaired in layers. All sponge, instrument, and needle counts were correct.   Findings: Densely scarred LUL with significant emphysematous blebs  Estimated Blood Loss:  less than 50 mL         Drains: 20 Fr chest tube in left pleural space         Total IV Fluids: per anes  Blood Given: none          Specimens: LUL wedge resection         Implants: none        Complications:  * No complications entered in OR log *         Disposition: PACU - hemodynamically stable.         Condition: stable

## 2019-04-17 NOTE — Anesthesia Preprocedure Evaluation (Signed)
Anesthesia Evaluation  Patient identified by MRN, date of birth, ID band Patient awake    Reviewed: Allergy & Precautions, NPO status , Patient's Chart, lab work & pertinent test results  Airway Mallampati: I  TM Distance: >3 FB Neck ROM: Full    Dental  (+) Edentulous Upper, Edentulous Lower   Pulmonary COPD, former smoker,    Pulmonary exam normal        Cardiovascular negative cardio ROS   Rhythm:Regular Rate:Normal     Neuro/Psych negative neurological ROS  negative psych ROS   GI/Hepatic negative GI ROS, Neg liver ROS,   Endo/Other  negative endocrine ROS  Renal/GU negative Renal ROS     Musculoskeletal   Abdominal Normal abdominal exam  (+)   Peds  Hematology negative hematology ROS (+)   Anesthesia Other Findings   Reproductive/Obstetrics                             Anesthesia Physical Anesthesia Plan  ASA: III  Anesthesia Plan: General   Post-op Pain Management:    Induction: Intravenous  PONV Risk Score and Plan: 3 and Ondansetron, Dexamethasone and Midazolam  Airway Management Planned: Double Lumen EBT  Additional Equipment: Arterial line  Intra-op Plan:   Post-operative Plan: Extubation in OR  Informed Consent: I have reviewed the patients History and Physical, chart, labs and discussed the procedure including the risks, benefits and alternatives for the proposed anesthesia with the patient or authorized representative who has indicated his/her understanding and acceptance.       Plan Discussed with: CRNA  Anesthesia Plan Comments:         Anesthesia Quick Evaluation

## 2019-04-17 NOTE — Anesthesia Procedure Notes (Signed)
Procedure Name: Intubation Date/Time: 04/17/2019 2:30 PM Performed by: Shirlyn Goltz, CRNA Pre-anesthesia Checklist: Patient identified, Emergency Drugs available, Suction available and Patient being monitored Patient Re-evaluated:Patient Re-evaluated prior to induction Oxygen Delivery Method: Circle system utilized Preoxygenation: Pre-oxygenation with 100% oxygen Induction Type: IV induction Ventilation: Mask ventilation without difficulty Laryngoscope Size: Mac and 4 Grade View: Grade I Endobronchial tube: Left and Double lumen EBT Number of attempts: 1 Placement Confirmation: ETT inserted through vocal cords under direct vision,  positive ETCO2 and breath sounds checked- equal and bilateral Tube secured with: Tape Dental Injury: Teeth and Oropharynx as per pre-operative assessment

## 2019-04-17 NOTE — Transfer of Care (Signed)
Immediate Anesthesia Transfer of Care Note  Patient: Dillon Olson  Procedure(s) Performed: Video Assisted Thoracoscopy (Vats) W Talc Pleuradesis (Left Chest) Wedge Resection of Left Upper Lobe (Left Chest)  Patient Location: PACU  Anesthesia Type:General  Level of Consciousness: awake and alert   Airway & Oxygen Therapy: Patient Spontanous Breathing and Patient connected to face mask oxygen  Post-op Assessment: Report given to RN and Post -op Vital signs reviewed and stable  Post vital signs: Reviewed and stable  Last Vitals:  Vitals Value Taken Time  BP 165/136 04/17/19 1712  Temp    Pulse 120 04/17/19 1715  Resp 19 04/17/19 1715  SpO2 100 % 04/17/19 1715  Vitals shown include unvalidated device data.  Last Pain:  Vitals:   04/17/19 1111  TempSrc:   PainSc: 3       Patients Stated Pain Goal: 0 (75/83/07 4600)  Complications: No apparent anesthesia complications

## 2019-04-17 NOTE — Brief Op Note (Addendum)
04/06/2019 - 04/17/2019  4:40 PM  PATIENT:  Dillon Olson  63 y.o. male  PRE-OPERATIVE DIAGNOSIS:  LEFT LUNG PERSISTENT AIR LEAK  POST-OPERATIVE DIAGNOSIS:  EMPHYSEMATOUS BLEBS  AND CHEST WALL ADHESIONS  PROCEDURE:  Procedure(s): Video Assisted Thoracoscopy (Vats) W Talc Pleuradesis (Left) Wedge Resection of Left Upper Lobe (Left)  SURGEON:  Surgeon(s) and Role:    * Wonda Olds, MD - Primary  PHYSICIAN ASSISTANT: WAYNE GOLD PA-C  ANESTHESIA:   general  EBL:  50 mL   BLOOD ADMINISTERED:none  DRAINS: 1 20 FRENCH  Chest Tube(s) in the LEFT HEMITHORAX   LOCAL MEDICATIONS USED:   EXPAREL   SPECIMEN:  Source of Specimen:  LUL WEDGE RESECTION  DISPOSITION OF SPECIMEN:  PATHOLOGY  COUNTS:  YES  TOURNIQUET:  * No tourniquets in log *  DICTATION: .Dragon Dictation  PLAN OF CARE: Admit to inpatient   PATIENT DISPOSITION:  PACU - hemodynamically stable.   Delay start of Pharmacological VTE agent (>24hrs) due to surgical blood loss or risk of bleeding: yes  COMPLICATIONS: NO KNOWN  Agree with Brief op note.  Camie Hauss Z. Orvan Seen, [place]

## 2019-04-17 NOTE — Progress Notes (Signed)
Report called to PACU nurse

## 2019-04-17 NOTE — Anesthesia Procedure Notes (Signed)
Arterial Line Insertion Start/End8/01/2019 1:00 PM, 04/17/2019 1:15 PM Performed by: Shirlyn Goltz, CRNA, CRNA  Patient location: Pre-op. Preanesthetic checklist: patient identified, IV checked, site marked, risks and benefits discussed, surgical consent, monitors and equipment checked, pre-op evaluation and anesthesia consent Lidocaine 1% used for infiltration Right, radial was placed Catheter size: 20 G Hand hygiene performed , maximum sterile barriers used  and Seldinger technique used Allen's test indicative of satisfactory collateral circulation Attempts: 2 Procedure performed without using ultrasound guided technique. Following insertion, Biopatch and dressing applied. Post procedure assessment: normal  Post procedure complications: local hematoma.

## 2019-04-17 NOTE — Progress Notes (Signed)
PROGRESS NOTE  ALEXSIS BRANSCOM KVQ:[id number] DOB: 11-22-1955 DOA: 04/06/2019 PCP: System, Pcp Not In  HPI/Recap of past 27 hours:  63 year old male reformed smoker with past medical history relevant for presumed left lung cancer not amenable to biopsy that was empirically treated with radiation therapy- previously undergone SBRT for presumed Stage 1A NSCLC in '17 (no biopsy). Admitted to [facility] in [place] on 04/01/2019 with pneumothorax, status post chest tube placement at outside facility. Transferred from [facility] in [place] to [place] on 04/06/2019 with persistent air leak from the left chest tube requiring CT surgery consult    04/17/19: Patient was seen and examined at his bedside this morning.  Resting comfortably, no specific complaints.  Chest tube continues on low wall suction.  Chest x-ray this morning shows no pneumothorax and left chest tube in stable position.  CTS plans left-sided VATS for pleurodesis and possible bleb stapling today.  Denies headache, no chest pain, no palpitations, no abdominal pain.  No acute events overnight per nursing staff.  Assessment/Plan: Principal Problem:   Pneumothorax on left Active Problems:   Solitary pulmonary nodule/presumed left lung cancer-status post prior radiation therapy   COPD with emphysema/Bleps   FTT (failure to thrive) in adult/left lung cancer related   Protein-calorie malnutrition, severe    Spontaneous pneumothorax  Patient initially presented to [facility] with pleuritic chest pain.  Found to have a pneumothorax in which a chest tube was placed.  [facility] to [facility] for cardiothoracic surgery evaluation.  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    COPD/emphysema w blebs Currently oxygenating well on room air. --Continue nebs as needed   DVT prophylaxis: Lovenox Code Status:Full Family Communication:None at bedside Disposition Plan:Per CT Surgery   Consultants:  CT surgery  Procedures:  CT placement  Chest tube replacement 7/28  Video bronchoscopy with insertion of intrabronchial valve 04/10/19  Left VATS w/ pleurodesis 04/17/2019  Objective: Vitals:   04/16/19 1700 04/16/19 1935 04/17/19 0343 04/17/19 0827  BP:  (!) 141/82 (!) 122/93 118/83  Pulse:  98 90 84  Resp: 17 19 15 15   Temp:  (!) 97.5 F (36.4 C) 98 F (36.7 C) 98.5 F (36.9 C)  TempSrc:  Oral Oral Oral  SpO2:  98% 96% 99%  Weight:      Height:        Intake/Output Summary (Last 24 hours) at 04/17/2019 1127 Last data filed at 04/17/2019 6433 Gross per 24 hour  Intake 1080 ml  Output 1756 ml  Net -676 ml   Filed Weights   04/13/19 1659  Weight: 43.8 kg    Exam:  . General: 63 y.o. year-old male Emaciated in no acute distress.  Alert and oriented x3.   . Cardiovascular: Regular rate and rhythm no rubs or gallops.  No JVD or thyromegaly noted.   Marland Kitchen Respiratory: Clear to auscultation no wheezes or rales.  Normal respiratory effort, oxygenating well on room air  left chest tube in place; now placed; on low wall suction . Abdomen: Soft nontender nondistended normal bowel sounds present. . Musculoskeletal: No lower extremity edema.  2 out of 4 pulses in all 4 extremities. Marland Kitchen Psychiatry: Mood is appropriate for condition and setting.  Data Reviewed: CBC: Recent Labs  Lab 04/17/19  0052  WBC 12.0*  HGB 13.8  HCT 39.8  MCV 94.5  PLT 701*   Basic Metabolic Panel: Recent Labs  Lab 04/11/19 0300 04/13/19 0254 04/17/19 0052  NA 138  --  137  K 3.9  --  3.8  CL 107  --  101  CO2 23  --  21*  GLUCOSE 105*  --  87  BUN 11  --  19  CREATININE 0.79 0.76 0.73  CALCIUM 9.0  --  9.4   GFR: Estimated Creatinine Clearance: 59.3 mL/min (by C-G formula based on SCr of 0.73 mg/dL). Liver Function Tests: Recent Labs  Lab 04/17/19 0052  AST 28  ALT 50*  ALKPHOS 98  BILITOT 0.3  PROT 6.0*  ALBUMIN 3.2*   No results for input(s): LIPASE, AMYLASE in the last 168 hours. No results for input(s): AMMONIA in the last 168 hours. Coagulation Profile: Recent Labs  Lab 04/17/19 0052  INR 1.1   Cardiac Enzymes: No results for input(s): CKTOTAL, CKMB, CKMBINDEX, TROPONINI in the last 168 hours. BNP (last 3 results) No results for input(s): PROBNP in the last 8760 hours. HbA1C: No results for input(s): HGBA1C in the last 72 hours. CBG: No results for input(s): GLUCAP in the last 168 hours. Lipid Profile: No results for input(s): CHOL, HDL, LDLCALC, TRIG, CHOLHDL, LDLDIRECT in the last 72 hours. Thyroid Function Tests: No results for input(s): TSH, T4TOTAL, FREET4, T3FREE, THYROIDAB in the last 72 hours. Anemia Panel: No results for input(s): VITAMINB12, FOLATE, FERRITIN, TIBC, IRON, RETICCTPCT in the last 72 hours. Urine analysis:    Component Value Date/Time   COLORURINE STRAW (A) 04/17/2019 0202   APPEARANCEUR CLEAR 04/17/2019 0202   LABSPEC 1.011 04/17/2019 0202   PHURINE 7.0 04/17/2019 0202   GLUCOSEU NEGATIVE 04/17/2019 0202   HGBUR NEGATIVE 04/17/2019 0202   BILIRUBINUR NEGATIVE 04/17/2019 0202   KETONESUR NEGATIVE 04/17/2019 0202   PROTEINUR NEGATIVE 04/17/2019 0202   NITRITE NEGATIVE 04/17/2019 0202   LEUKOCYTESUR NEGATIVE 04/17/2019 0202   Sepsis Labs: @LABRCNTIP (procalcitonin:4,lacticidven:4)  ) Recent Results (from the past 240 hour(s))   Surgical pcr screen     Status: None   Collection Time: 04/17/19  2:03 AM   Specimen: Nasal Mucosa; Nasal Swab  Result Value Ref Range Status   MRSA, PCR NEGATIVE NEGATIVE Final   Staphylococcus aureus NEGATIVE NEGATIVE Final    Comment: (NOTE) The Xpert SA Assay (FDA approved for NASAL specimens in patients 50 years of age and older), is one component of a comprehensive surveillance program. It is not intended to diagnose infection nor to guide or monitor treatment. Performed at [facility], [place] [address], [place], [place] [address]       Studies: Dg Chest Port 1 View  Result Date: 04/17/2019 CLINICAL DATA:  Shortness of breath.  Pneumothorax.  Chest tube. EXAM: PORTABLE CHEST 1 VIEW COMPARISON:  One-view chest x-ray 04/16/2019 FINDINGS: Left-sided chest tube is stable in position. Minimal basilar pneumothorax is likely still present. There is no significant change. No significant apically pneumothorax is present. Apical pleural-parenchymal scarring is stable, left greater than right. Emphysematous changes are noted bilaterally. Heart size is normal. IMPRESSION: 1. Stable chest x-ray  with probable tiny basilar pneumothorax. 2. Stable position of chest tube. Electronically Signed   By: San Morelle M.D.   On: 04/17/2019 08:18    Scheduled Meds: . enoxaparin (LOVENOX) injection  30 mg Subcutaneous Q24H  . feeding supplement (ENSURE ENLIVE)  237 mL Oral TID BM  . lidocaine  1 patch Transdermal Q24H  . megestrol  400 mg Oral Daily  . mirtazapine  15 mg Oral QHS  . senna-docusate  2 tablet Oral BID    Continuous Infusions: .  ceFAZolin (ANCEF) IV    . lactated ringers Stopped (04/10/19 1224)     LOS: 11 days     Benjaman Artman J [place] DO [facility] Pager [phone]  If 7PM-7AM, please contact night-coverage [email or web address] Password [email or web address] 04/17/2019, 11:27 AM

## 2019-04-17 NOTE — Progress Notes (Signed)
Patient c/o sharp pain with breathing 20/10 , Jadene Pierini PA notified, verbal order received for fentanyl 25mg  Q2H PRN will continue to monitor.

## 2019-04-18 ENCOUNTER — Inpatient Hospital Stay (HOSPITAL_COMMUNITY): Payer: No Typology Code available for payment source

## 2019-04-18 ENCOUNTER — Encounter (HOSPITAL_COMMUNITY): Payer: Self-pay | Admitting: Cardiothoracic Surgery

## 2019-04-18 LAB — CBC
HCT: 33.1 % — ABNORMAL LOW (ref 39.0–52.0)
Hemoglobin: 11.5 g/dL — ABNORMAL LOW (ref 13.0–17.0)
MCH: 32.9 pg (ref 26.0–34.0)
MCHC: 34.7 g/dL (ref 30.0–36.0)
MCV: 94.6 fL (ref 80.0–100.0)
Platelets: 331 10*3/uL (ref 150–400)
RBC: 3.5 MIL/uL — ABNORMAL LOW (ref 4.22–5.81)
RDW: 14.1 % (ref 11.5–15.5)
WBC: 16.3 10*3/uL — ABNORMAL HIGH (ref 4.0–10.5)
nRBC: 0 % (ref 0.0–0.2)

## 2019-04-18 LAB — BLOOD GAS, ARTERIAL
Acid-base deficit: 0 mmol/L (ref 0.0–2.0)
Bicarbonate: 23 mmol/L (ref 20.0–28.0)
FIO2: 21
O2 Saturation: 96.5 %
Patient temperature: 98.6
pCO2 arterial: 30.5 mmHg — ABNORMAL LOW (ref 32.0–48.0)
pH, Arterial: 7.49 — ABNORMAL HIGH (ref 7.350–7.450)
pO2, Arterial: 81.6 mmHg — ABNORMAL LOW (ref 83.0–108.0)

## 2019-04-18 LAB — BASIC METABOLIC PANEL
Anion gap: 11 (ref 5–15)
BUN: 17 mg/dL (ref 8–23)
CO2: 21 mmol/L — ABNORMAL LOW (ref 22–32)
Calcium: 8.5 mg/dL — ABNORMAL LOW (ref 8.9–10.3)
Chloride: 105 mmol/L (ref 98–111)
Creatinine, Ser: 0.77 mg/dL (ref 0.61–1.24)
GFR calc Af Amer: >60 mL/min (ref >60)
GFR calc non Af Amer: >60 mL/min (ref >60)
Glucose, Bld: 112 mg/dL — ABNORMAL HIGH (ref 70–99)
Potassium: 3.8 mmol/L (ref 3.5–5.1)
Sodium: 137 mmol/L (ref 135–145)

## 2019-04-18 LAB — MAGNESIUM: Magnesium: 1.9 mg/dL (ref 1.7–2.4)

## 2019-04-18 NOTE — Progress Notes (Signed)
      [address]       [place] [address]             [phone]      1 Day Post-Op Procedure(s) (LRB): Video Assisted Thoracoscopy (Vats) W Talc Pleuradesis (Left) Wedge Resection of Left Upper Lobe (Left)   Subjective:  No new complaints.  He had a lot of pain overnight which made it difficult to sleep.   Objective: Vital signs in last 24 hours: Temp:  [97 F (36.1 C)-98.5 F (36.9 C)] 97.8 F (36.6 C) (08/06 0431) Pulse Rate:  [84-124] 99 (08/06 0431) Cardiac Rhythm: Normal sinus rhythm (08/06 0722) Resp:  [15-31] 16 (08/06 0431) BP: (94-145)/(64-95) 94/64 (08/06 0431) SpO2:  [96 %-100 %] 100 % (08/06 0431) Arterial Line BP: (144-177)/(72-99) 148/72 (08/05 1820) Weight:  [43 kg] 43 kg (08/05 1303)  Intake/Output from previous day: 08/05 0701 - 08/06 0700 In: 1000 [I.V.:1000] Out: 1460 [Urine:1350; Blood:50; Chest Tube:60] Intake/Output this shift: Total I/O In: -  Out: 103 [Chest Tube:103]  General appearance: alert, cooperative and no distress Heart: regular rate and rhythm Lungs: clear to auscultation bilaterally Abdomen: soft, non-tender; bowel sounds normal; no masses,  no organomegaly Extremities: extremities normal, atraumatic, no cyanosis or edema Wound: clean and dry  Lab Results: Recent Labs    04/17/19 0052 04/18/19 0736  WBC 12.0* 16.3*  HGB 13.8 11.5*  HCT 39.8 33.1*  PLT 481* 331   BMET:  Recent Labs    04/17/19 0052 04/18/19 0600  NA 137 137  K 3.8 3.8  CL 101 105  CO2 21* 21*  GLUCOSE 87 112*  BUN 19 17  CREATININE 0.73 0.77  CALCIUM 9.4 8.5*    PT/INR:  Recent Labs    04/17/19 0052  LABPROT 13.8  INR 1.1   ABG    Component Value Date/Time   PHART 7.490 (H) 04/18/2019 0630   HCO3 23.0 04/18/2019 0630   ACIDBASEDEF 0.0 04/18/2019 0630   O2SAT 96.5 04/18/2019 0630   CBG (last 3)  No results for input(s): GLUCAP in the last 72 hours.  Assessment/Plan: S/P Procedure(s) (LRB): Video Assisted  Thoracoscopy (Vats) W Talc Pleuradesis (Left) Wedge Resection of Left Upper Lobe (Left)  1. Chest tube- on suction, +1 air leak with cough- CXR with small apical space- leave in place today 2. CV- hemodynamically stable, in NSR 3. D/C Arterial line 4. IV fluids to KVO 5. Dispo- patient stable, leave chest tube to suction, repeat CXR in AM   LOS: 12 days    Aretta Stetzel 04/18/2019

## 2019-04-18 NOTE — Progress Notes (Signed)
PROGRESS NOTE  Dillon Olson QPY:[id number] DOB: [birth year] DOA: 04/06/2019 PCP: System, Pcp Not In  HPI/Recap of past 71 hours:  63 year old male reformed smoker with past medical history relevant for presumed left lung cancer not amenable to biopsy that was empirically treated with radiation therapy- previously undergone SBRT for presumed Stage 1A NSCLC in '17 (no biopsy). Admitted to [facility] in [place] on 04/01/2019 with pneumothorax, status post chest tube placement at outside facility. Transferred from [facility] in [place] to [place] on 04/06/2019 with persistent air leak from the left chest tube requiring CT surgery consult    04/18/19: Patient was seen and examined at his bedside this morning.  Resting comfortably, no specific complaints.  Chest tube continues on low wall suction.  Chest x-ray this morning shows no pneumothorax and left chest tube in stable position.  Denies headache, no chest pain, no palpitations, no abdominal pain. No acute events overnight per nursing staff.  CTS has taken over as attending role.  Discussed with Ellwood Handler PA, no other chronic medical conditions concerning at this time; and they will continue to manage his chest tube.  Assessment/Plan: Principal Problem:   Pneumothorax on left Active Problems:   Solitary pulmonary nodule/presumed left lung cancer-status post prior radiation therapy   COPD with emphysema/Bleps   FTT (failure to thrive) in adult/left lung cancer related   Protein-calorie malnutrition, severe    Spontaneous pneumothorax  Patient initially presented to [facility] with pleuritic chest pain.  Found to have a pneumothorax in which a chest tube was placed.  Transfer to [facility] for [facility] evaluation.  Patient underwent bronchoscopy with intrabronchial valve placement x 4 by CTS on 04/10/19 --Chest tube output past 24 hours 60 mL --CXR 8/6 with no definite  pneumothorax and left chest tube in stable position; personally reviewed --Chest tube continues to low wall suction per CTS --s/p L VATS, pleurodesis, and wedge resection left upper lobe on 04/17/2019 --Further per cardiothoracic surgery  Non small cell left lung cancer post radiation --Follow up outpatient at [facility]  FTT likely 2/2 to malignancy --Continue Megace and oral supplements --Advised to increase oral protein calorie intake as tolerated.    COPD/emphysema w blebs Currently oxygenating well on room air. --Continue nebs as needed  At this time, cardiothoracic surgery has taken over as attending.  No other acute medical issues during this hospitalization.  Discussed with CTS PA, Erin Barrett that the hospital service will now sign off.  If any further assistance needed, please feel free to reach out to [place] at any time.   DVT prophylaxis: Lovenox Code Status:Full Family Communication:None at bedside Disposition Plan:Per CT Surgery   Consultants:  CT surgery  Procedures:  CT placement  Chest tube replacement 7/28  Video bronchoscopy with insertion of intrabronchial valve 04/10/19  Left VATS w/ pleurodesis and LUL wedge resection 04/17/2019  Objective: Vitals:   04/17/19 1805 04/17/19 1820 04/17/19 1958 04/18/19 0431  BP: 128/73 133/89 (!) 137/95 94/64  Pulse: (!) 113 (!) 124 (!) 123 99  Resp: (!) 22 (!) 31 (!) 22 16  Temp: (!) 97 F (36.1 C) (!) 97.3 F (36.3 C) 97.8 F (36.6 C) 97.8 F (36.6 C)  TempSrc:  Oral Oral Oral  SpO2: 98% 96% 100% 100%  Weight:      Height:        Intake/Output Summary (Last 24 hours) at 04/18/2019 1248 Last data filed at 04/18/2019 0845 Gross per  24 hour  Intake 1180 ml  Output 1138 ml  Net 42 ml   Filed Weights   04/13/19 1659 04/17/19 1303  Weight: 43.8 kg 43 kg    Exam:   General: 63 y.o. year-old male Emaciated in no acute distress.  Alert and oriented x3.    Cardiovascular: Regular rate  and rhythm no rubs or gallops.  No JVD or thyromegaly noted.    Respiratory: Clear to auscultation no wheezes or rales.  Normal respiratory effort, oxygenating well on room air left chest tube in place; now placed; on low wall suction  Abdomen: Soft nontender nondistended normal bowel sounds present.  Musculoskeletal: No lower extremity edema.  2 out of 4 pulses in all 4 extremities.  Psychiatry: Mood is appropriate for condition and setting.  Data Reviewed: CBC: Recent Labs  Lab 04/17/19 0052 04/18/19 0736  WBC 12.0* 16.3*  HGB 13.8 11.5*  HCT 39.8 33.1*  MCV 94.5 94.6  PLT 481* 497   Basic Metabolic Panel: Recent Labs  Lab 04/13/19 0254 04/17/19 0052 04/18/19 0600  NA  --  137 137  K  --  3.8 3.8  CL  --  101 105  CO2  --  21* 21*  GLUCOSE  --  87 112*  BUN  --  19 17  CREATININE 0.76 0.73 0.77  CALCIUM  --  9.4 8.5*  MG  --   --  1.9   GFR: Estimated Creatinine Clearance: 58.2 mL/min (by C-G formula based on SCr of 0.77 mg/dL). Liver Function Tests: Recent Labs  Lab 04/17/19 0052  AST 28  ALT 50*  ALKPHOS 98  BILITOT 0.3  PROT 6.0*  ALBUMIN 3.2*   No results for input(s): LIPASE, AMYLASE in the last 168 hours. No results for input(s): AMMONIA in the last 168 hours. Coagulation Profile: Recent Labs  Lab 04/17/19 0052  INR 1.1   Cardiac Enzymes: No results for input(s): CKTOTAL, CKMB, CKMBINDEX, TROPONINI in the last 168 hours. BNP (last 3 results) No results for input(s): PROBNP in the last 8760 hours. HbA1C: No results for input(s): HGBA1C in the last 72 hours. CBG: No results for input(s): GLUCAP in the last 168 hours. Lipid Profile: No results for input(s): CHOL, HDL, LDLCALC, TRIG, CHOLHDL, LDLDIRECT in the last 72 hours. Thyroid Function Tests: No results for input(s): TSH, T4TOTAL, FREET4, T3FREE, THYROIDAB in the last 72 hours. Anemia Panel: No results for input(s): VITAMINB12, FOLATE, FERRITIN, TIBC, IRON, RETICCTPCT in the last 72  hours. Urine analysis:    Component Value Date/Time   COLORURINE STRAW (A) 04/17/2019 0202   APPEARANCEUR CLEAR 04/17/2019 0202   LABSPEC 1.011 04/17/2019 0202   PHURINE 7.0 04/17/2019 0202   GLUCOSEU NEGATIVE 04/17/2019 0202   HGBUR NEGATIVE 04/17/2019 0202   BILIRUBINUR NEGATIVE 04/17/2019 0202   KETONESUR NEGATIVE 04/17/2019 0202   PROTEINUR NEGATIVE 04/17/2019 0202   NITRITE NEGATIVE 04/17/2019 0202   LEUKOCYTESUR NEGATIVE 04/17/2019 0202   Sepsis Labs: @LABRCNTIP (procalcitonin:4,lacticidven:4)  ) Recent Results (from the past 240 hour(s))  Surgical pcr screen     Status: None   Collection Time: 04/17/19  2:03 AM   Specimen: Nasal Mucosa; Nasal Swab  Result Value Ref Range Status   MRSA, PCR NEGATIVE NEGATIVE Final   Staphylococcus aureus NEGATIVE NEGATIVE Final    Comment: (NOTE) The Xpert SA Assay (FDA approved for NASAL specimens in patients 33 years of age and older), is one component of a comprehensive surveillance program. It is not intended to diagnose infection nor  to guide or monitor treatment. Performed at [facility], [place] [address], [place], [place] [address]       Studies: Dg Chest Port 1 View  Result Date: 04/18/2019 CLINICAL DATA:  Follow-up left pneumothorax. EXAM: PORTABLE CHEST 1 VIEW COMPARISON:  April 17, 2019 FINDINGS: The left chest tube is stable. There is a probable tiny pneumothorax in the medial left lung base, smaller in the interval. Bullous changes remain in the right apex. The lungs are otherwise clear. The cardiomediastinal silhouette is unremarkable. Postsurgical changes are seen on the left. IMPRESSION: 1. The left chest tube remains in place. A tiny medial basilar pneumothorax remains, smaller in the interval. No other changes. Continued bullous changes in the right upper lobe. Postsurgical changes on the left. Electronically Signed   By: Dorise Bullion III M.D   On: 04/18/2019 09:06   Dg Chest Port 1 View  Result Date:  04/17/2019 CLINICAL DATA:  Follow-up pneumothorax, status post wedge resection left upper lobe EXAM: PORTABLE CHEST 1 VIEW COMPARISON:  04/17/2019, 04/16/2019, 04/15/2019, 04/14/2019, 04/13/2019 FINDINGS: Postsurgical changes in the cervical spine. Left mid lung pigtail drainage catheter has been removed. Interim insertion of left chest tube with tip at the left apex. Resection of previously noted spiculated opacity/cavitary lesion in the left upper lobe. Postsurgical changes in the left hilar and suprahilar lung. Probable small residual pneumothorax at the medial left lung base. No midline shift. Normal heart size. Bullous emphysematous disease at the right lung apex. IMPRESSION: 1. Interval resection of previously noted spiculated cavitary left upper lobe lesion with placement of new left chest tube with tip at the left apex. Suspect small medial left lung base pneumothorax 2. Emphysematous disease with large bulla in the right upper lobe Electronically Signed   By: Donavan Foil M.D.   On: 04/17/2019 17:34    Scheduled Meds:  acetaminophen  1,000 mg Oral Q6H   Or   acetaminophen (TYLENOL) oral liquid 160 mg/5 mL  1,000 mg Oral Q6H   bisacodyl  10 mg Oral Daily   enoxaparin (LOVENOX) injection  30 mg Subcutaneous Q24H   feeding supplement (ENSURE ENLIVE)  237 mL Oral TID BM   ketorolac  15 mg Intravenous Q8H   lidocaine  1 patch Transdermal Q24H   megestrol  400 mg Oral Daily   mirtazapine  15 mg Oral QHS   senna-docusate  2 tablet Oral BID    Continuous Infusions:  dextrose 5 % and 0.9% NaCl 10 mL/hr at 04/18/19 1000     LOS: 12 days     Zaela Graley J [place] DO [facility] Pager [phone]  If 7PM-7AM, please contact night-coverage [email or web address] Password [email or web address] 04/18/2019, 12:48 PM

## 2019-04-18 NOTE — Anesthesia Postprocedure Evaluation (Signed)
Anesthesia Post Note  Patient: Dillon Olson  Procedure(s) Performed: Video Assisted Thoracoscopy (Vats) W Talc Pleuradesis (Left Chest) Wedge Resection of Left Upper Lobe (Left Chest)     Patient location during evaluation: PACU Anesthesia Type: General Level of consciousness: awake and alert Pain management: pain level controlled Vital Signs Assessment: post-procedure vital signs reviewed and stable Respiratory status: spontaneous breathing, nonlabored ventilation, respiratory function stable and patient connected to nasal cannula oxygen Cardiovascular status: blood pressure returned to baseline and stable Postop Assessment: no apparent nausea or vomiting Anesthetic complications: no    Last Vitals:  Vitals:   04/17/19 1958 04/18/19 0431  BP: (!) 137/95 94/64  Pulse: (!) 123 99  Resp: (!) 22 16  Temp: 36.6 C 36.6 C  SpO2: 100% 100%    Last Pain:  Vitals:   04/18/19 0801  TempSrc:   PainSc: McLeod Chastity Noland

## 2019-04-19 ENCOUNTER — Inpatient Hospital Stay (HOSPITAL_COMMUNITY): Payer: No Typology Code available for payment source

## 2019-04-19 LAB — COMPREHENSIVE METABOLIC PANEL
ALT: 34 U/L (ref 0–44)
AST: 43 U/L — ABNORMAL HIGH (ref 15–41)
Albumin: 2.4 g/dL — ABNORMAL LOW (ref 3.5–5.0)
Alkaline Phosphatase: 71 U/L (ref 38–126)
Anion gap: 10 (ref 5–15)
BUN: 19 mg/dL (ref 8–23)
CO2: 26 mmol/L (ref 22–32)
Calcium: 8.6 mg/dL — ABNORMAL LOW (ref 8.9–10.3)
Chloride: 102 mmol/L (ref 98–111)
Creatinine, Ser: 0.85 mg/dL (ref 0.61–1.24)
GFR calc Af Amer: >60 mL/min (ref >60)
GFR calc non Af Amer: >60 mL/min (ref >60)
Glucose, Bld: 113 mg/dL — ABNORMAL HIGH (ref 70–99)
Potassium: 4.5 mmol/L (ref 3.5–5.1)
Sodium: 138 mmol/L (ref 135–145)
Total Bilirubin: 0.4 mg/dL (ref 0.3–1.2)
Total Protein: 5.2 g/dL — ABNORMAL LOW (ref 6.5–8.1)

## 2019-04-19 LAB — CBC
HCT: 33.8 % — ABNORMAL LOW (ref 39.0–52.0)
Hemoglobin: 11.5 g/dL — ABNORMAL LOW (ref 13.0–17.0)
MCH: 32.2 pg (ref 26.0–34.0)
MCHC: 34 g/dL (ref 30.0–36.0)
MCV: 94.7 fL (ref 80.0–100.0)
Platelets: 396 10*3/uL (ref 150–400)
RBC: 3.57 MIL/uL — ABNORMAL LOW (ref 4.22–5.81)
RDW: 14.5 % (ref 11.5–15.5)
WBC: 14.5 10*3/uL — ABNORMAL HIGH (ref 4.0–10.5)
nRBC: 0 % (ref 0.0–0.2)

## 2019-04-19 MED ORDER — GENERIC EXTERNAL MEDICATION
Status: DC
Start: ? — End: 2019-04-19

## 2019-04-19 NOTE — Discharge Instructions (Signed)
Discharge Instructions:  1. You may shower, please wash incisions daily with soap and water and keep dry.  If you wish to cover wounds with dressing you may do so but please keep clean and change daily.  No tub baths or swimming until incisions have completely healed.  If your incisions become red or develop any drainage please call our office at [phone]  2. No Driving until cleared by Dr. Orvan Seen' office and you are no longer using narcotic pain medications  3. Monitor your weight daily.. Please use the same scale and weigh at same time... If you gain 5-10 lbs in 48 hours with associated lower extremity swelling, please contact our office at [phone]  4. Fever of 101.5 for at least 24 hours with no source, please contact our office at [phone]  5. Activity- up as tolerated, please walk at least 3 times per day.  Avoid strenuous activity, no lifting, pushing, or pulling with your arms over 8-10 lbs for a minimum of 6 weeks  6. If any questions or concerns arise, please do not hesitate to contact our office at [phone]  Pneumothorax   A pneumothorax is commonly called a collapsed lung. It is a condition in which air leaks from a lung and builds up between the thin layer of tissue that covers the lungs (visceral pleura) and the interior wall of the chest cavity (parietal pleura). The air gets trapped outside the lung, between the lung and the chest wall (pleural space). The air takes up space and prevents the lung from fully expanding. This condition sometimes occurs suddenly with no apparent cause. The buildup of air may be small or large. A small pneumothorax may go away on its own. A large pneumothorax will require treatment and hospitalization. What are the causes? This condition may be caused by:  Trauma and injury to the chest wall.  Surgery and other medical procedures.  A complication of an underlying lung problem, especially chronic obstructive pulmonary disease  (COPD) or emphysema. Sometimes the cause of this condition is not known. What increases the risk? You are more likely to develop this condition if:  You have an underlying lung problem.  You smoke.  You are 61-10 years old, male, tall, and underweight.  You have a personal or family history of pneumothorax.  You have an eating disorder (anorexia nervosa). This condition can also happen quickly, even in people with no history of lung problems. What are the signs or symptoms? Sometimes a pneumothorax will have no symptoms. When symptoms are present, they can include:  Chest pain.  Shortness of breath.  Increased rate of breathing.  Bluish color to your lips or skin (cyanosis). How is this diagnosed? This condition may be diagnosed by:  A medical history and physical exam.  A chest X-ray, chest CT scan, or ultrasound. How is this treated? Treatment depends on how severe your condition is. The goal of treatment is to remove the extra air and allow your lung to expand back to its normal size.  For a small pneumothorax: ? No treatment may be needed. ? Extra oxygen is sometimes used to make it go away more quickly.  For a large pneumothorax or a pneumothorax that is causing symptoms, a procedure is done to drain the air from your lungs. To do this, a health care provider may use: ? A needle with a syringe. This is used to suck air from a pleural space where no additional leakage is taking place. ? A chest  tube. This is used to suck air where there is ongoing leakage into the pleural space. The chest tube may need to remain in place for several days until the air leak has healed.  In more severe cases, surgery may be needed to repair the damage that is causing the leak.  If you have multiple pneumothorax episodes or have an air leak that will not heal, a procedure called a pleurodesis may be done. A medicine is placed in the pleural space to irritate the tissues around the lung so  that the lung will stick to the chest wall, seal any leaks, and stop any buildup of air in that space. If you have an underlying lung problem, severe symptoms, or a large pneumothorax you will usually need to stay in the hospital. Follow these instructions at home: Lifestyle  Do not use any products that contain nicotine or tobacco, such as cigarettes and e-cigarettes. These are major risk factors in pneumothorax. If you need help quitting, ask your health care provider.  Do not lift anything that is heavier than 10 lb (4.5 kg), or the limit that your health care provider tells you, until he or she says that it is safe.  Avoid activities that take a lot of effort (strenuous) for as long as told by your health care provider.  Return to your normal activities as told by your health care provider. Ask your health care provider what activities are safe for you.  Do not fly in an airplane or scuba dive until your health care provider says it is okay. General instructions  Take over-the-counter and prescription medicines only as told by your health care provider.  If a cough or pain makes it difficult for you to sleep at night, try sleeping in a semi-upright position in a recliner or by using 2 or 3 pillows.  If you had a chest tube and it was removed, ask your health care provider when you can remove the bandage (dressing). While the dressing is in place, do not allow it to get wet.  Keep all follow-up visits as told by your health care provider. This is important. Contact a health care provider if:  You cough up thick mucus (sputum) that is yellow or green in color.  You were treated with a chest tube, and you have redness, increasing pain, or discharge at the site where it was placed. Get help right away if:  You have increasing chest pain or shortness of breath.  You have a cough that will not go away.  You begin coughing up blood.  You have pain that is getting worse or is not  controlled with medicines.  The site where your chest tube was located opens up.  You feel air coming out of the site where the chest tube was placed.  You have a fever or persistent symptoms for more than 2-3 days.  You have a fever and your symptoms suddenly get worse. These symptoms may represent a serious problem that is an emergency. Do not wait to see if the symptoms will go away. Get medical help right away. Call your local emergency services (911 in the [place] Do not drive yourself to the hospital. Summary  A pneumothorax, commonly called a collapsed lung, is a condition in which air leaks from a lung and gets trapped between the lung and the chest wall (pleural space).  The buildup of air may be small or large. A small pneumothorax may go away on its own.  A large pneumothorax will require treatment and hospitalization.  Treatment for this condition depends on how severe the pneumothorax is. The goal of treatment is to remove the extra air and allow the lung to expand back to its normal size. This information is not intended to replace advice given to you by your health care provider. Make sure you discuss any questions you have with your health care provider. Document Released: 08/29/2005 Document Revised: 08/11/2017 Document Reviewed: 08/07/2017 Elsevier Patient Education  2020 [employer].

## 2019-04-19 NOTE — Progress Notes (Signed)
2 Days Post-Op Procedure(s) (LRB): Video Assisted Thoracoscopy (Vats) W Talc Pleuradesis (Left) Wedge Resection of Left Upper Lobe (Left) Subjective: Feels good. No SOB. Excited to eat breakfast.   Objective: Vital signs in last 24 hours: Temp:  [97.7 F (36.5 C)-98.2 F (36.8 C)] 97.7 F (36.5 C) (08/07 0458) Pulse Rate:  [85-103] 85 (08/07 0458) Cardiac Rhythm: Normal sinus rhythm (08/06 2000) Resp:  [16-17] 16 (08/07 0458) BP: (134-136)/(83-86) 134/86 (08/07 0458) SpO2:  [97 %-98 %] 98 % (08/07 0458)     Intake/Output from previous day: 08/06 0701 - 08/07 0700 In: 1234.7 [P.O.:420; I.V.:814.7] Out: 1062 [Urine:925; Chest Tube:137] Intake/Output this shift: No intake/output data recorded.  General appearance: alert, cooperative and no distress Heart: regular rate and rhythm, S1, S2 normal, no murmur, click, rub or gallop Lungs: clear to auscultation bilaterally Abdomen: soft, non-tender; bowel sounds normal; no masses,  no organomegaly Extremities: extremities normal, atraumatic, no cyanosis or edema Wound: clean and dry  Lab Results: Recent Labs    04/18/19 0736 04/19/19 0355  WBC 16.3* 14.5*  HGB 11.5* 11.5*  HCT 33.1* 33.8*  PLT 331 396   BMET:  Recent Labs    04/18/19 0600 04/19/19 0355  NA 137 138  K 3.8 4.5  CL 105 102  CO2 21* 26  GLUCOSE 112* 113*  BUN 17 19  CREATININE 0.77 0.85  CALCIUM 8.5* 8.6*    PT/INR:  Recent Labs    04/17/19 0052  LABPROT 13.8  INR 1.1   ABG    Component Value Date/Time   PHART 7.490 (H) 04/18/2019 0630   HCO3 23.0 04/18/2019 0630   ACIDBASEDEF 0.0 04/18/2019 0630   O2SAT 96.5 04/18/2019 0630   CBG (last 3)  No results for input(s): GLUCAP in the last 72 hours.  Assessment/Plan: S/P Procedure(s) (LRB): Video Assisted Thoracoscopy (Vats) W Talc Pleuradesis (Left) Wedge Resection of Left Upper Lobe (Left)   1. Chest tube- on water seal- CXR  Stable this morning-tidal wave but no visible leak 2. CV-  hemodynamically stable, in NSR 3. H and H 11.5/33.8 4. Renal-creatinine 0.85, electrolytes okay  Plan: Keep to water seal and get follow-up CXR in the morning. Hopefully can remove chest tube soon.      LOS: 13 days    Elgie Collard 04/19/2019

## 2019-04-19 NOTE — Discharge Summary (Signed)
Physician Discharge Summary  Patient ID: Dillon Olson MRN: [id number] DOB/AGE: [birth year] 63 y.o.  Admit date: 04/06/2019 Discharge date: 04/22/2019  Admission Diagnoses: Spontaneous left pneumothorax Solitary left pulmonary nodule presumed lung cancer, s/p radiation therapy COPD with bullous emphysema Protein calorie malnutrition  Discharge Diagnoses:  Principal Problem:   Pneumothorax on left Active Problems:   Solitary pulmonary nodule/presumed left lung cancer-status post prior radiation therapy   COPD with emphysema/Bleps   FTT (failure to thrive) in adult/left lung cancer related   Protein-calorie malnutrition, severe   Discharged Condition: good  History of Present Illness: 63 year old gentleman with a history of left lung cancer status post definitive radiation was in usual state of health until approximately 1 week ago when he experienced relatively acute onset of right followed by left chest pain.  He presented to the [facility] clinic in [place] for evaluation on 04/01/2019.  CT scan of the chest was done which demonstrated pneumothorax on the left.  He was advised to present to the [place] facility across the drive.  He was admitted and had a chest tube placed.  The patient spent 5 days in the hospital at [facility] but became dissatisfied with care and requested transfer to [facility] where he had undergone his radiation therapy treatment.  He arrived in stable condition with a chest tube in place.  Patient denies any fevers.  He he states that his pain has essentially resolved.  He reports a decreased appetite and weight loss recently. CXR on admission demonstrated a persistent small apical PTX with an active air leak.   Hospital Course:  Mr. Bushey was admitted to the medicine service and our service was consulted. The patient was evaluated by Dr. Orvan Seen.  On 04/09/19,  the chest tube was noted to be outside the chest so a new pigtail pleural tube was placed at  the bedside. The air leak persisted.   Due to the persistent air leak, Dr. Orvan Seen asked Dr. Roxan Hockey to consider placement of intrabronchial valves. This was offered to the patient and he decided to proceed. The procedure was carried out on 04/10/19 and is detailed in the operative note below. This resulted in a significant decrease in the air leak observed in the PleurEvac. The pneumothorax resolved but the air leak persisted for several more days. Dr. Orvan Seen offered a VATS procedure to the patient for pleurodesis and possible bleb resection. The decision was made to proceed with surgery so the patient was taken to the OR on 04/17/19 where left VATS was performed with upper lobe wedge resection of bullous disease and talc pleurodesis. Following the procedure the patient was transferred back to [facility] where he remained stable.  There was a small air leak on the first post-op day so the chest tube was left to suction.  The chest tube was placed to water seal and on POD2 the air leak was noted to have resolved.  Chest tube was removed on 8/0 and CXR has remained stable post removal without a pneumothorax. He is clinically stable for discharge on todays date.   Consults: None  Significant Diagnostic Studies:   EXAM: CT CHEST WITHOUT CONTRAST  TECHNIQUE: Multidetector CT imaging of the chest was performed following the standard protocol without IV contrast.  COMPARISON:  None.  FINDINGS: Cardiovascular: The heart size is normal. Coronary artery calcifications are noted. Atherosclerotic changes are noted of the thoracic aorta. There is no large pericardial effusion.  Mediastinum/Nodes:  --No mediastinal or hilar lymphadenopathy.  --No axillary  lymphadenopathy.  --No supraclavicular lymphadenopathy.  --Normal thyroid gland.  --The esophagus is unremarkable  Lungs/Pleura: There are severe emphysematous changes bilaterally with large apical bulla bilaterally. A left-sided chest tube  is in place. There is a small to moderate sized left-sided pneumothorax. There is a cavitary lesion in the left upper lobe measuring approximately 3.7 by 2.7 cm. This lesion is tethered to the pleural surface. There may be a small soft tissue density filling defect within the dependent portion of this lesion. The trachea is unremarkable.  Upper Abdomen: No acute abnormality.  Musculoskeletal: No chest wall abnormality. No acute or significant osseous findings.  Review of the MIP images confirms the above findings.  IMPRESSION: 1. Small left-sided pneumothorax. A left-sided chest tube is in place. 2. Severe emphysematous changes bilaterally with large biapical bulla. 3. Cavitary lesion in the left upper lobe as detailed above, likely represents the patient's reported lung cancer. No prior imaging is available. Correlation with patient history and prior outside studies is recommended for further evaluation.  Aortic Atherosclerosis (ICD10-I70.0) and Emphysema (ICD10-J43.9).   Electronically Signed   By: Constance Holster M.D.   On: 04/06/2019 21:26  EXAM: PORTABLE CHEST 1 VIEW  COMPARISON:  04/18/2019  FINDINGS: Stable left chest tube position. Postop changes in the left hilum. Endobronchial valves noted. Similar appearance of the left lung base suggesting tiny medial basilar left pneumothorax. No significant interval change. No developing effusion. Lungs are hyperinflated. Background emphysema changes noted.  Normal heart size and vascularity.  Trachea is midline.  IMPRESSION: Stable left chest tube with findings suggesting persistent small left medial basilar pneumothorax.  Stable postoperative findings in the left hilum  Stable hyperinflation   Electronically Signed   By: Jerilynn Mages.  Shick M.D.   On: 04/19/2019 08:33  Treatments: Procedures  Procedure(s): Video Assisted Thoracoscopy (Vats) W Talc Pleuradesis Wedge Resection of Left Upper Lobe  Procedure Note  Dillon Olson male 63 y.o. 04/17/2019  Procedure(s) and Anesthesia Type:    * Video Assisted Thoracoscopy (Vats) W Talc Pleuradesis - General    * Wedge Resection of Left Upper Lobe - General Extensive pneumo-adhesiolysis (30 minutes) Multi-level rib block with Experel liposomal bupivicaine  Surgeon(s) and Role:    Wonda Olds, MD - Primary   Indications: The patient was admitted to the hospital with left spontaneous PTX; failed to heal air leak with conservative measures including bronchial valve placement; unable to consistently wean chest tube off suction. Decision was made to perform thoracoscopic exploration for pleurodesis. The patient now presents for this procedure after discussing therapeutic alternatives.        Surgeon: Wonda Olds   Assistants: Jadene Pierini PA-C  Anesthesia: General endotracheal - Double lumen tube  ASA Class: 3  Discharge Exam: Blood pressure 136/83, pulse 83, temperature 98.2 F (36.8 C), temperature source Oral, resp. rate (!) 28, height 5\' 5"  (1.651 m), weight 43 kg, SpO2 98 %.  General appearance: alert, cooperative and no distress Heart: regular rate and rhythm Lungs: clear to auscultation bilaterally Abdomen: benign Extremities: no calf tenderness or edema Wound: incis healing well Disposition: Discharge disposition: 01-Home or Self Care       Discharge Instructions    Discharge patient   Complete by: As directed    Discharge disposition: 01-Home or Self Care   Discharge patient date: 04/22/2019     Allergies as of 04/22/2019      Reactions   Codeine Palpitations      Medication List  STOP taking these medications   oxyCODONE-acetaminophen 10-325 MG tablet Commonly known as: PERCOCET     TAKE these medications   cimetidine 200 MG tablet Commonly known as: TAGAMET Take 200 mg by mouth daily as needed (heartburn).   cyclobenzaprine 5 MG tablet Commonly known as: FLEXERIL Take  2.5-5 mg by mouth daily as needed for muscle spasms.   Ensure Plus Liqd Take 237 mLs by mouth daily.   HYDROcodone-acetaminophen 10-325 MG tablet Commonly known as: NORCO Take 1 tablet by mouth every 6 (six) hours as needed for up to 7 days for moderate pain or severe pain.      Follow-up Information    Wonda Olds, MD. Go on 05/03/2019.   Specialty: Cardiothoracic Surgery Why: You have an appointment with Dr. Orvan Seen on Friday 05/03/19. Please arrive 30 minutes early for a chest x-ray to be done at [facility] located on the first floor of the same building.  Contact information: [place] [address] [phone]           Signed: John Giovanni 04/22/2019, 7:28 AM

## 2019-04-20 ENCOUNTER — Inpatient Hospital Stay (HOSPITAL_COMMUNITY): Payer: No Typology Code available for payment source

## 2019-04-20 LAB — CREATININE, SERUM
Creatinine, Ser: 0.69 mg/dL (ref 0.61–1.24)
GFR calc Af Amer: >60 mL/min (ref >60)
GFR calc non Af Amer: >60 mL/min (ref >60)

## 2019-04-20 MED ORDER — GENERIC EXTERNAL MEDICATION
Status: DC
Start: ? — End: 2019-04-20

## 2019-04-20 NOTE — Progress Notes (Signed)
[address]       [place] [address]             [phone]      3 Days Post-Op Procedure(s) (LRB): Video Assisted Thoracoscopy (Vats) W Talc Pleuradesis (Left) Wedge Resection of Left Upper Lobe (Left) Subjective: Some pain from incision/Chest tube  Objective: Vital signs in last 24 hours: Temp:  [97.5 F (36.4 C)-97.9 F (36.6 C)] 97.5 F (36.4 C) (08/08 0434) Pulse Rate:  [98-102] 98 (08/08 0434) Cardiac Rhythm: Normal sinus rhythm (08/07 1926) Resp:  [11-25] 19 (08/08 0434) BP: (130-140)/(89-90) 136/90 (08/08 0434) SpO2:  [96 %-98 %] 98 % (08/08 0434)  Hemodynamic parameters for last 24 hours:    Intake/Output from previous day: 08/07 0701 - 08/08 0700 In: 1582.3 [P.O.:1440; I.V.:142.3] Out: 3400 [Urine:3350; Chest Tube:50] Intake/Output this shift: Total I/O In: -  Out: 200 [Urine:200]  General appearance: alert, cooperative and no distress Heart: regular rate and rhythm Lungs: clear to auscultation bilaterally Abdomen: benign Extremities: no edema or calf tenderness Wound: incis healing well  Lab Results: Recent Labs    04/18/19 0736 04/19/19 0355  WBC 16.3* 14.5*  HGB 11.5* 11.5*  HCT 33.1* 33.8*  PLT 331 396   BMET:  Recent Labs    04/18/19 0600 04/19/19 0355 04/20/19 0557  NA 137 138  --   K 3.8 4.5  --   CL 105 102  --   CO2 21* 26  --   GLUCOSE 112* 113*  --   BUN 17 19  --   CREATININE 0.77 0.85 0.69  CALCIUM 8.5* 8.6*  --     PT/INR: No results for input(s): LABPROT, INR in the last 72 hours. ABG    Component Value Date/Time   PHART 7.490 (H) 04/18/2019 0630   HCO3 23.0 04/18/2019 0630   ACIDBASEDEF 0.0 04/18/2019 0630   O2SAT 96.5 04/18/2019 0630   CBG (last 3)  No results for input(s): GLUCAP in the last 72 hours.  Meds Scheduled Meds: . acetaminophen  1,000 mg Oral Q6H   Or  . acetaminophen (TYLENOL) oral liquid 160 mg/5 mL  1,000 mg Oral Q6H  . bisacodyl  10 mg Oral Daily  . enoxaparin  (LOVENOX) injection  30 mg Subcutaneous Q24H  . feeding supplement (ENSURE ENLIVE)  237 mL Oral TID BM  . lidocaine  1 patch Transdermal Q24H  . megestrol  400 mg Oral Daily  . mirtazapine  15 mg Oral QHS  . senna-docusate  2 tablet Oral BID   Continuous Infusions: . dextrose 5 % and 0.9% NaCl Stopped (04/19/19 1734)   PRN Meds:.cyclobenzaprine, fentaNYL (SUBLIMAZE) injection, HYDROcodone-acetaminophen, ipratropium-albuterol, ondansetron (ZOFRAN) IV  Xrays Dg Chest Port 1 View  Result Date: 04/19/2019 CLINICAL DATA:  Pneumothorax, left chest 2 EXAM: PORTABLE CHEST 1 VIEW COMPARISON:  04/18/2019 FINDINGS: Stable left chest tube position. Postop changes in the left hilum. Endobronchial valves noted. Similar appearance of the left lung base suggesting tiny medial basilar left pneumothorax. No significant interval change. No developing effusion. Lungs are hyperinflated. Background emphysema changes noted. Normal heart size and vascularity.  Trachea is midline. IMPRESSION: Stable left chest tube with findings suggesting persistent small left medial basilar pneumothorax. Stable postoperative findings in the left hilum Stable hyperinflation Electronically Signed   By: Jerilynn Mages.  Shick M.D.   On: 04/19/2019 08:33    Assessment/Plan: S/P Procedure(s) (LRB): Video Assisted Thoracoscopy (Vats) W Talc Pleuradesis (Left) Wedge Resection of Left Upper Lobe (Left)  1 doing well overall , small air leak with cough, will leave to H2O seal 2 hemodyn stable in sinus rhythm, no fevers 3 sats good on RA- cont pulm toilet 4 CXR stable without pntx 5 no new labs  LOS: 14 days    John Giovanni PA-C 04/20/2019 Pager [phone]

## 2019-04-21 ENCOUNTER — Inpatient Hospital Stay (HOSPITAL_COMMUNITY): Payer: No Typology Code available for payment source

## 2019-04-21 MED ORDER — GENERIC EXTERNAL MEDICATION
Status: DC
Start: ? — End: 2019-04-21

## 2019-04-21 NOTE — Progress Notes (Addendum)
[address]       [place] [address]             [phone]      4 Days Post-Op Procedure(s) (LRB): Video Assisted Thoracoscopy (Vats) W Talc Pleuradesis (Left) Wedge Resection of Left Upper Lobe (Left) Subjective: Feels ok  Objective: Vital signs in last 24 hours: Temp:  [97.4 F (36.3 C)-97.9 F (36.6 C)] 97.4 F (36.3 C) (08/09 0449) Pulse Rate:  [97-103] 97 (08/09 0449) Cardiac Rhythm: Normal sinus rhythm (08/09 0449) Resp:  [15-18] 18 (08/09 0449) BP: (110-146)/(84-95) 110/84 (08/09 0449) SpO2:  [96 %-97 %] 97 % (08/09 0449)  Hemodynamic parameters for last 24 hours:    Intake/Output from previous day: 08/08 0701 - 08/09 0700 In: 2480 [P.O.:2480] Out: 1560 [Urine:1480; Chest Tube:80] Intake/Output this shift: Total I/O In: -  Out: 175 [Urine:175]  General appearance: alert, cooperative and no distress Heart: regular rate and rhythm Lungs: clear to auscultation bilaterally Abdomen: benign Extremities: no edema or calf tenderness Wound: incis healing well  Lab Results: Recent Labs    04/19/19 0355  WBC 14.5*  HGB 11.5*  HCT 33.8*  PLT 396   BMET:  Recent Labs    04/19/19 0355 04/20/19 0557  NA 138  --   K 4.5  --   CL 102  --   CO2 26  --   GLUCOSE 113*  --   BUN 19  --   CREATININE 0.85 0.69  CALCIUM 8.6*  --     PT/INR: No results for input(s): LABPROT, INR in the last 72 hours. ABG    Component Value Date/Time   PHART 7.490 (H) 04/18/2019 0630   HCO3 23.0 04/18/2019 0630   ACIDBASEDEF 0.0 04/18/2019 0630   O2SAT 96.5 04/18/2019 0630   CBG (last 3)  No results for input(s): GLUCAP in the last 72 hours.  Meds Scheduled Meds: . acetaminophen  1,000 mg Oral Q6H   Or  . acetaminophen (TYLENOL) oral liquid 160 mg/5 mL  1,000 mg Oral Q6H  . bisacodyl  10 mg Oral Daily  . enoxaparin (LOVENOX) injection  30 mg Subcutaneous Q24H  . feeding supplement (ENSURE ENLIVE)  237 mL Oral TID BM  . lidocaine  1 patch  Transdermal Q24H  . megestrol  400 mg Oral Daily  . mirtazapine  15 mg Oral QHS  . senna-docusate  2 tablet Oral BID   Continuous Infusions: . dextrose 5 % and 0.9% NaCl Stopped (04/19/19 1734)   PRN Meds:.cyclobenzaprine, fentaNYL (SUBLIMAZE) injection, HYDROcodone-acetaminophen, ipratropium-albuterol, ondansetron (ZOFRAN) IV  Xrays Dg Chest Port 1 View  Result Date: 04/21/2019 CLINICAL DATA:  Postsurgical follow-up EXAM: PORTABLE CHEST 1 VIEW COMPARISON:  Chest radiograph from one day prior. FINDINGS: Stable left apical chest tube, left perihilar bronchial foul cysts and surgical hardware from ACDF in the lower cervical spine. Stable cardiomediastinal silhouette with normal heart size. No pneumothorax. No pleural effusion. Severe emphysema. No pulmonary edema. Surgical sutures in medial upper left lung. No pulmonary edema. No acute consolidative airspace disease. IMPRESSION: No pneumothorax. Stable left chest tube position. Severe emphysema. Postsurgical changes in the left hemithorax. No acute pulmonary disease. Electronically Signed   By: Ilona Sorrel M.D.   On: 04/21/2019 06:50   Dg Chest Port 1 View  Result Date: 04/20/2019 CLINICAL DATA:  63 year old male with history of chest tube for pneumothorax. EXAM: PORTABLE CHEST 1 VIEW COMPARISON:  Chest x-ray 04/19/2019. FINDINGS: Left-sided chest tube with tip directed  into the apex of the left hemithorax. No appreciable residual left pneumothorax. Numerous bronchial occlude or devices are seen projecting in the left suprahilar region. Mild elevation of the left hemidiaphragm. Right lung is clear. Emphysematous changes in the lungs bilaterally. No pleural effusions. No evidence of pulmonary edema. Heart size is normal. Upper mediastinal contours are within normal limits. Orthopedic fixation hardware in the lower cervical spine incidentally noted. IMPRESSION: 1. Support apparatus, as above. 2. No appreciable left pneumothorax noted on today's  examination. 3. Advanced emphysematous changes in the lungs bilaterally again noted. Electronically Signed   By: [name]   On: 04/20/2019 10:03    Assessment/Plan: S/P Procedure(s) (LRB): Video Assisted Thoracoscopy (Vats) W Talc Pleuradesis (Left) Wedge Resection of Left Upper Lobe (Left)  1 doing well overall. No pntx and no air leak today- will d/c chest tube 2 hemodyn stable, occ sinus tachy 3 sats good on RA 4 no new labs 5 poss home in am   LOS: 15 days    Gaspar Bidding 04/21/2019 Pager [phone]  Agree with above CT out today Home tomorrow

## 2019-04-21 NOTE — Progress Notes (Addendum)
Pt had sleeping deprivation which was caused by pain. We had concerned lots of medication wasted from Fentanyl ordered 25 ucg q 2 hr; One vial contained 100 ucg. Pt stated he took Percucet 10 mg TID at home regularly before this admission. Pt had requested Fentanyl every 2 hours and Norco every 4 hours. He stated his pain scale 4-6/ 10, but wanted to prevent severe pain when ambulated and coughed. When he ambulated, HR increased to 130 -140. Sinus tachycardia on monitor, BP stable. SPO2 97 at room air. No respiratory distress seen.  Chest tube under water sealed with minimal air leaking when coughed, did not detect any subcutaneous emphysema. Continue to monitor.  Kennyth Lose, RN

## 2019-04-21 NOTE — Progress Notes (Signed)
Chest tube removed per order.

## 2019-04-22 ENCOUNTER — Inpatient Hospital Stay (HOSPITAL_COMMUNITY): Payer: No Typology Code available for payment source

## 2019-04-22 LAB — BLOOD GAS, ARTERIAL
Acid-Base Excess: 0.7 mmol/L (ref 0.0–2.0)
Bicarbonate: 24.1 mmol/L (ref 20.0–28.0)
Drawn by: 27027
FIO2: 21
O2 Saturation: 97.4 %
Patient temperature: 98.6
pCO2 arterial: 33.6 mmHg (ref 32.0–48.0)
pH, Arterial: 7.469 — ABNORMAL HIGH (ref 7.350–7.450)
pO2, Arterial: 94 mmHg (ref 83.0–108.0)

## 2019-04-22 MED ORDER — HYDROCODONE-ACETAMINOPHEN 10-325 MG PO TABS: 1.0000 | ORAL_TABLET | Freq: Four times a day (QID) | ORAL | 0 refills | Status: DC | PRN

## 2019-04-22 NOTE — Progress Notes (Signed)
Discharge AVS meds take and those due reviewed with pt. Follow up appointments and when to call MD reviewed. All questions and concerns addressed. No further questions at this time. D/c IV and TELE, CCMD notified. D/C home per orders. Brought out via wheelchair with staff.

## 2019-04-22 NOTE — Progress Notes (Signed)
[name]       [name] [address]             [phone]      5 Days Post-Op Procedure(s) (LRB): Video Assisted Thoracoscopy (Vats) W Talc Pleuradesis (Left) Wedge Resection of Left Upper Lobe (Left) Subjective: Feels fine, no SOB  Objective: Vital signs in last 24 hours: Temp:  [98.1 F (36.7 C)-98.2 F (36.8 C)] 98.2 F (36.8 C) (08/10 0448) Pulse Rate:  [83-108] 83 (08/10 0448) Cardiac Rhythm: Normal sinus rhythm (08/10 0448) Resp:  [23-28] 28 (08/09 2000) BP: (136-137)/(83-92) 136/83 (08/09 2000) SpO2:  [98 %] 98 % (08/10 0448)  Hemodynamic parameters for last 24 hours:    Intake/Output from previous day: 08/09 0701 - 08/10 0700 In: 1180 [P.O.:1180] Out: 785 [Urine:775; Chest Tube:10] Intake/Output this shift: No intake/output data recorded.  General appearance: alert, cooperative and no distress Heart: regular rate and rhythm Lungs: clear to auscultation bilaterally Abdomen: benign Extremities: no calf tenderness or edema Wound: incis healing well  Lab Results: No results for input(s): WBC, HGB, HCT, PLT in the last 72 hours. BMET:  Recent Labs    04/20/19 0557  CREATININE 0.69    PT/INR: No results for input(s): LABPROT, INR in the last 72 hours. ABG    Component Value Date/Time   PHART 7.490 (H) 04/18/2019 0630   HCO3 23.0 04/18/2019 0630   ACIDBASEDEF 0.0 04/18/2019 0630   O2SAT 96.5 04/18/2019 0630   CBG (last 3)  No results for input(s): GLUCAP in the last 72 hours.  Meds Scheduled Meds: . acetaminophen  1,000 mg Oral Q6H   Or  . acetaminophen (TYLENOL) oral liquid 160 mg/5 mL  1,000 mg Oral Q6H  . bisacodyl  10 mg Oral Daily  . enoxaparin (LOVENOX) injection  30 mg Subcutaneous Q24H  . feeding supplement (ENSURE ENLIVE)  237 mL Oral TID BM  . lidocaine  1 patch Transdermal Q24H  . megestrol  400 mg Oral Daily  . mirtazapine  15 mg Oral QHS  . senna-docusate  2 tablet Oral BID   Continuous Infusions: .  dextrose 5 % and 0.9% NaCl Stopped (04/19/19 1734)   PRN Meds:.cyclobenzaprine, fentaNYL (SUBLIMAZE) injection, HYDROcodone-acetaminophen, ipratropium-albuterol, ondansetron (ZOFRAN) IV  Xrays Dg Chest Port 1 View  Result Date: 04/21/2019 CLINICAL DATA:  Follow-up examination. EXAM: PORTABLE CHEST 1 VIEW COMPARISON:  Chest x-ray dated April 21, 2019 at 4:50 a.m. FINDINGS: The left-sided chest tube has been removed. There is no definite left-sided pneumothorax. Multiple endobronchial plugs are noted on the left. Surgical staples are noted in the left upper lung zone. There is no right-sided pneumothorax. There is a stable lucency at the right lung apex favored to represent a pickle bleb. There is stable blunting of the left costophrenic angle. There is no displaced fracture. IMPRESSION: Status post removal of the left-sided chest tube with no evidence for left-sided pneumothorax. Otherwise, stable appearance of the chest. Electronically Signed   By: Constance Holster M.D.   On: 04/21/2019 15:53   Dg Chest Port 1 View  Result Date: 04/21/2019 CLINICAL DATA:  Postsurgical follow-up EXAM: PORTABLE CHEST 1 VIEW COMPARISON:  Chest radiograph from one day prior. FINDINGS: Stable left apical chest tube, left perihilar bronchial foul cysts and surgical hardware from ACDF in the lower cervical spine. Stable cardiomediastinal silhouette with normal heart size. No pneumothorax. No pleural effusion. Severe emphysema. No pulmonary edema. Surgical sutures in medial upper left lung. No pulmonary edema. No  acute consolidative airspace disease. IMPRESSION: No pneumothorax. Stable left chest tube position. Severe emphysema. Postsurgical changes in the left hemithorax. No acute pulmonary disease. Electronically Signed   By: Ilona Sorrel M.D.   On: 04/21/2019 06:50   Dg Chest Port 1 View  Result Date: 04/20/2019 CLINICAL DATA:  63 year old male with history of chest tube for pneumothorax. EXAM: PORTABLE CHEST 1 VIEW  COMPARISON:  Chest x-ray 04/19/2019. FINDINGS: Left-sided chest tube with tip directed into the apex of the left hemithorax. No appreciable residual left pneumothorax. Numerous bronchial occlude or devices are seen projecting in the left suprahilar region. Mild elevation of the left hemidiaphragm. Right lung is clear. Emphysematous changes in the lungs bilaterally. No pleural effusions. No evidence of pulmonary edema. Heart size is normal. Upper mediastinal contours are within normal limits. Orthopedic fixation hardware in the lower cervical spine incidentally noted. IMPRESSION: 1. Support apparatus, as above. 2. No appreciable left pneumothorax noted on today's examination. 3. Advanced emphysematous changes in the lungs bilaterally again noted. Electronically Signed   By: [name]   On: 04/20/2019 10:03    Assessment/Plan: S/P Procedure(s) (LRB): Video Assisted Thoracoscopy (Vats) W Talc Pleuradesis (Left) Wedge Resection of Left Upper Lobe (Left)  1 doing well, hemodyn stable with occas sinus tachy 2 sats good on RA 3 CXR is stable without pntx 4 stable for discharge   LOS: 16 days    Dillon Olson [facility] 04/22/2019 Pager [phone]

## 2019-04-22 NOTE — Progress Notes (Signed)
Care plan reviewed, Pt is progressing. His pain well controlled to night with Tylenol q 6hrs and Narco q 4 hrs. Chest tube removed yesterday at am. His SPO2 at room air 98%. Ambulated in his room , well tolerated with out any exertion. Vital signs remained stable. No acute distress noted tonight. Continue to monitor.  Kennyth Lose, RN

## 2019-04-29 ENCOUNTER — Ambulatory Visit (INDEPENDENT_AMBULATORY_CARE_PROVIDER_SITE_OTHER): Payer: Self-pay

## 2019-04-29 ENCOUNTER — Encounter (HOSPITAL_COMMUNITY): Payer: Self-pay | Admitting: Cardiothoracic Surgery

## 2019-04-29 ENCOUNTER — Other Ambulatory Visit: Payer: Self-pay

## 2019-04-29 ENCOUNTER — Telehealth: Payer: Self-pay

## 2019-04-29 ENCOUNTER — Other Ambulatory Visit: Payer: Self-pay | Admitting: Physician Assistant

## 2019-04-29 DIAGNOSIS — Z4802 Encounter for removal of sutures: Secondary | ICD-10-CM | POA: Insufficient documentation

## 2019-04-29 MED ORDER — HYDROCODONE-ACETAMINOPHEN 10-325 MG PO TABS: 1.0000 | ORAL_TABLET | Freq: Three times a day (TID) | ORAL | 0 refills | Status: DC | PRN

## 2019-04-29 MED ORDER — HYDROCODONE-ACETAMINOPHEN 10-325 MG PO TABS: 1.0000 | ORAL_TABLET | Freq: Four times a day (QID) | ORAL | 0 refills | Status: DC | PRN

## 2019-04-29 NOTE — Progress Notes (Signed)
Patient arrived for nurse visit to remove 2 sutures post- procedure VATS/ wedge LUL.  Sutures removed left chest with no signs/ symptoms of infection noted.  Patient tolerated procedure well.  Patient instructed to keep the incision sites clean and dry.  Clean with soap and water.  Patient acknowledged instructions given.   Patient advised if any infection noted after removal to give the office a return call.  Patient acknowledged receipt.

## 2019-04-29 NOTE — Telephone Encounter (Signed)
Patient requested refill of Norco.  Patient stated he has been taking it every 4 hours instead of every 6 as prescribed.  Dr. Orvan Seen aware.  Johann Capers, PA given script for patient to pick up in the office.  Patient made aware and will pick-up 04/30/19.  Patient needed to use prescription sparingly as this may be the last refill given.

## 2019-05-02 ENCOUNTER — Other Ambulatory Visit: Payer: Self-pay | Admitting: Cardiothoracic Surgery

## 2019-05-02 DIAGNOSIS — J939 Pneumothorax, unspecified: Secondary | ICD-10-CM

## 2019-05-02 NOTE — Progress Notes (Signed)
cxt

## 2019-05-02 NOTE — Progress Notes (Unsigned)
cxr 

## 2019-05-03 ENCOUNTER — Ambulatory Visit (INDEPENDENT_AMBULATORY_CARE_PROVIDER_SITE_OTHER): Payer: Self-pay | Admitting: Cardiothoracic Surgery

## 2019-05-03 ENCOUNTER — Ambulatory Visit
Admission: RE | Admit: 2019-05-03 | Discharge: 2019-05-03 | Disposition: A | Discharge status: Home / Self Care | Payer: No Typology Code available for payment source | Source: Ambulatory Visit | Attending: Cardiothoracic Surgery | Admitting: Cardiothoracic Surgery

## 2019-05-03 ENCOUNTER — Other Ambulatory Visit: Payer: Self-pay

## 2019-05-03 VITALS — BP 145/82 | HR 87 | Temp 96.8°F | Resp 20 | Ht 65.0 in | Wt 100.0 lb

## 2019-05-03 DIAGNOSIS — Z09 Encounter for follow-up examination after completed treatment for conditions other than malignant neoplasm: Secondary | ICD-10-CM

## 2019-05-03 DIAGNOSIS — J939 Pneumothorax, unspecified: Secondary | ICD-10-CM

## 2019-05-03 NOTE — Progress Notes (Signed)
      [address]       [place] [address]             [facility] OFFICE NOTE  Referring Provider is Bonnell Public, MD Primary Cardiologist is No primary care provider on file. 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  He originally underwent IBV treatment but the air leak persisted.  Therefore we took him to the operating room for left VATS wedge resection and pleurodesis.  He did well following this and was recently discharged.  He is scheduled to follow-up with his radiation oncologist and with [facility] [place].   Current Outpatient Medications  Medication Sig Dispense Refill  . cimetidine (TAGAMET) 200 MG tablet Take 200 mg by mouth daily as needed (heartburn).    . cyclobenzaprine (FLEXERIL) 5 MG tablet Take 2.5-5 mg by mouth daily as needed for muscle spasms.     . ENSURE PLUS (ENSURE PLUS) LIQD Take 237 mLs by mouth daily.     Marland Kitchen HYDROcodone-acetaminophen (NORCO) 10-325 MG tablet Take 1 tablet by mouth every 8 (eight) hours as needed for moderate pain or severe pain. 15 tablet 0   No current facility-administered medications for this visit.       Physical Exam:   BP (!) 145/82   Pulse 87   Temp (!) 96.8 F (36 C) (Skin)   Resp 20   Ht 5\' 5"  (1.651 m)   Wt 45.4 kg   SpO2 97% Comment: RA  BMI 16.64 kg/m   General:  Well-appearing  Chest:   Clear to auscultation  CV:   Regular  rhythm  Incisions:  Well-healed   Diagnostic Tests:  Chest x-ray with small left apical hydropneumothorax likely a space issue   Impression:  Doing well after left VATS wedge resection and pleurodesis  Plan:  Follow up as needed with this clinic; f/u with Dr. Roxan Hockey in 2-3 weeks for planning IBV removal  I spent in excess of 15 minutes during the  conduct of this office consultation and >50% of this time involved direct face-to-face encounter with the patient for counseling and/or coordination of their care.  Level 2                 10 minutes Level 3                 15 minutes Level 4                 25 minutes Level 5                 40 minutes  B. Murvin Natal, MD 05/03/2019 12:03 PM

## 2019-05-06 ENCOUNTER — Ambulatory Visit
Admission: RE | Admit: 2019-05-06 | Discharge: 2019-05-06 | Disposition: A | Discharge status: Home / Self Care | Payer: Non-veteran care | Source: Ambulatory Visit | Attending: Radiation Oncology | Admitting: Radiation Oncology

## 2019-05-06 ENCOUNTER — Other Ambulatory Visit: Payer: Self-pay

## 2019-05-06 ENCOUNTER — Encounter: Payer: Self-pay | Admitting: Radiation Oncology

## 2019-05-06 VITALS — BP 131/82 | HR 100 | Temp 98.7°F | Resp 18 | Ht 65.0 in | Wt 97.4 lb

## 2019-05-06 DIAGNOSIS — J939 Pneumothorax, unspecified: Secondary | ICD-10-CM | POA: Insufficient documentation

## 2019-05-06 DIAGNOSIS — J90 Pleural effusion, not elsewhere classified: Secondary | ICD-10-CM | POA: Insufficient documentation

## 2019-05-06 DIAGNOSIS — J948 Other specified pleural conditions: Secondary | ICD-10-CM | POA: Diagnosis not present

## 2019-05-06 DIAGNOSIS — J439 Emphysema, unspecified: Secondary | ICD-10-CM | POA: Insufficient documentation

## 2019-05-06 DIAGNOSIS — R911 Solitary pulmonary nodule: Secondary | ICD-10-CM | POA: Insufficient documentation

## 2019-05-06 DIAGNOSIS — R232 Flushing: Secondary | ICD-10-CM | POA: Insufficient documentation

## 2019-05-06 NOTE — Progress Notes (Signed)
Radiation Oncology         [phone] ________________________________  Name: Dillon Olson MRN: [id number]  Date: 05/06/2019  DOB: [birth year]  Follow-Up Visit Note  CC: System, Pcp Not In  Leeann Must, MD    ICD-10-CM   1. Solitary pulmonary nodule/presumed left lung cancer-status post prior radiation therapy  R91.1     Diagnosis:   Solitary pulmonary nodule presenting in the left upper lung  Interval Since Last Radiation:  2 years, 8  months   12/18, 12/20, 09/02/16: Left upper lung / 54 Gy in 3 fractions (SBRT)  Narrative:  The patient returns today for routine follow-up.    Since his last visit, he was admitted to [facility] in [place] on 04/01/2019 for collapsed left lung. He was transferred to [facility] on 04/06/2019. Per hospitalist's note that day, there was a leak in the chest tube that had been placed at [facility]. He underwent bronchoscopy with IBV placement on 04/10/2019, but the air leak persisted and proceeded to left upper lobe wedge resection on 04/17/2019. Pathology from the resection showed: pleural blebs with associated nonspecific calcifications; few granulomas with focal necrosis.  No evidence of malignancy. he last saw Dr. Orvan Seen in cardiothoracic surgery on 05/03/2019. Chest x-ray performed at that visit showed small left apical hydropneumothorax likely a space issue.  On review of systems, he reports continued shortness of breath with exertion. He also reports night sweats that have occurred every night since surgery, as well as loss of appetite. He denies cough, hemoptysis, and difficulty swallowing.  The patient has been receiving his CT scans through the [place]:  is allergic to codeine.  Meds: Current Outpatient Medications  Medication Sig Dispense Refill   cyclobenzaprine (FLEXERIL) 5 MG tablet Take 2.5-5 mg by mouth daily as needed for muscle spasms.      ENSURE PLUS (ENSURE PLUS) LIQD Take 237 mLs by mouth daily.       HYDROcodone-acetaminophen (NORCO) 10-325 MG tablet Take 1 tablet by mouth every 8 (eight) hours as needed for moderate pain or severe pain. 15 tablet 0   cimetidine (TAGAMET) 200 MG tablet Take 200 mg by mouth daily as needed (heartburn).     No current facility-administered medications for this encounter.     Physical Findings: The patient is in no acute distress. Patient is alert and oriented.  height is 5\' 5"  (1.651 m) and weight is 97 lb 6 oz (44.2 kg). His temporal temperature is 98.7 F (37.1 C). His blood pressure is 131/82 and his pulse is 100. His respiration is 18 and oxygen saturation is 98%. .  Lungs are clear to auscultation bilaterally. Heart has regular rate and rhythm. No palpable cervical, supraclavicular, or axillary adenopathy. Abdomen soft, non-tender, normal bowel sounds.  Patient has a small scar along the left anterior chest from his recent wedge resection.  No signs of drainage or infection  Lab Findings: Lab Results  Component Value Date   WBC 14.5 (H) 04/19/2019   HGB 11.5 (L) 04/19/2019   HCT 33.8 (L) 04/19/2019   MCV 94.7 04/19/2019   PLT 396 04/19/2019    Radiographic Findings: Dg Chest 2 View  Result Date: 05/03/2019 CLINICAL DATA:  . Chest pain, shortness of breath history of wedge resection in pneumothorax EXAM: CHEST - 2 VIEW COMPARISON:  The 07/02/2019 FINDINGS: Again seen is an air-fluid level in the right apex, similar to prior study compatible with loculated left hydropneumothorax. Postoperative changes on the  left. There is hyperinflation of the lungs compatible with COPD. Heart is normal size. No confluent airspace opacities. IMPRESSION: Stable loculated left hydropneumothorax. COPD. Electronically Signed   By: Rolm Baptise M.D.   On: 05/03/2019 11:30   Dg Chest 2 View  Result Date: 04/22/2019 CLINICAL DATA:  Status post surgery and multiple interventions for spontaneous left pneumothorax. EXAM: CHEST - 2 VIEW COMPARISON:  04/21/2019 FINDINGS: The  heart size and mediastinal contours are within normal limits. Air-fluid level localized to the superior and anterior aspect of the left hemithorax is consistent with a loculated hydropneumothorax. This is relatively small. No large component of pneumothorax identified. Stable appearance of bronchial valves and surgical staples on the left. Stable emphysema. No pleural effusions. The visualized skeletal structures are unremarkable. IMPRESSION: Air-fluid level localizing to the superior and anterior left hemithorax is consistent with a residual loculated hydropneumothorax. Electronically Signed   By: Aletta Edouard M.D.   On: 04/22/2019 08:05   Dg Chest 2 View  Result Date: 04/08/2019 CLINICAL DATA:  Left pneumothorax with a left-sided chest tube. EXAM: CHEST - 2 VIEW COMPARISON:  04/06/2019 FINDINGS: Cavitary lesion in the left upper lobe again noted with surrounding fibrotic changes. Small left apical pneumothorax. Left-sided chest tube in satisfactory position. Small left pleural effusion. Bilateral emphysematous changes again noted. Right perihilar fibrotic changes. Bilateral bullous disease. No focal consolidation. Stable cardiomediastinal silhouette. No aggressive osseous lesion. IMPRESSION: 1. Left-sided chest tube in satisfactory position. Persistent small left hydropneumothorax. Electronically Signed   By: Kathreen Devoid   On: 04/08/2019 09:24   Ct Chest Wo Contrast  Result Date: 04/06/2019 CLINICAL DATA:  Pneumothorax follow-up EXAM: CT CHEST WITHOUT CONTRAST TECHNIQUE: Multidetector CT imaging of the chest was performed following the standard protocol without IV contrast. COMPARISON:  None. FINDINGS: Cardiovascular: The heart size is normal. Coronary artery calcifications are noted. Atherosclerotic changes are noted of the thoracic aorta. There is no large pericardial effusion. Mediastinum/Nodes: --No mediastinal or hilar lymphadenopathy. --No axillary lymphadenopathy. --No supraclavicular  lymphadenopathy. --Normal thyroid gland. --The esophagus is unremarkable Lungs/Pleura: There are severe emphysematous changes bilaterally with large apical bulla bilaterally. A left-sided chest tube is in place. There is a small to moderate sized left-sided pneumothorax. There is a cavitary lesion in the left upper lobe measuring approximately 3.7 by 2.7 cm. This lesion is tethered to the pleural surface. There may be a small soft tissue density filling defect within the dependent portion of this lesion. The trachea is unremarkable. Upper Abdomen: No acute abnormality. Musculoskeletal: No chest wall abnormality. No acute or significant osseous findings. Review of the MIP images confirms the above findings. IMPRESSION: 1. Small left-sided pneumothorax. A left-sided chest tube is in place. 2. Severe emphysematous changes bilaterally with large biapical bulla. 3. Cavitary lesion in the left upper lobe as detailed above, likely represents the patient's reported lung cancer. No prior imaging is available. Correlation with patient history and prior outside studies is recommended for further evaluation. Aortic Atherosclerosis (ICD10-I70.0) and Emphysema (ICD10-J43.9). Electronically Signed   By: Constance Holster M.D.   On: 04/06/2019 21:26   Dg Chest 1v Repeat Same Day  Result Date: 04/09/2019 CLINICAL DATA:  63 year old male chest tube placement. Spontaneous left pneumothorax. EXAM: CHEST - 1 VIEW SAME DAY COMPARISON:  0743 hours today and earlier. FINDINGS: Portable AP upright view at 0917 hours. New pigtail type left chest tube in place. Only trace residual pneumothorax is visible along the lateral margin of the descending aorta. Underlying hyperinflation and lung scarring with  architectural distortion in the apices greater on the left. No pleural effusion or acute pulmonary opacity. Normal cardiac size and mediastinal contours. Visualized tracheal air column is within normal limits. No acute osseous abnormality  identified. Prior cervical ACDF. Negative visible bowel gas pattern. IMPRESSION: 1. Left chest tube placed with substantially reduced left pneumothorax, trace residual. 2. Underlying chronic lung disease. Electronically Signed   By: Genevie Ann M.D.   On: 04/09/2019 09:58   Dg Chest Port 1 View  Result Date: 04/21/2019 CLINICAL DATA:  Follow-up examination. EXAM: PORTABLE CHEST 1 VIEW COMPARISON:  Chest x-ray dated April 21, 2019 at 4:50 a.m. FINDINGS: The left-sided chest tube has been removed. There is no definite left-sided pneumothorax. Multiple endobronchial plugs are noted on the left. Surgical staples are noted in the left upper lung zone. There is no right-sided pneumothorax. There is a stable lucency at the right lung apex favored to represent a pickle bleb. There is stable blunting of the left costophrenic angle. There is no displaced fracture. IMPRESSION: Status post removal of the left-sided chest tube with no evidence for left-sided pneumothorax. Otherwise, stable appearance of the chest. Electronically Signed   By: Constance Holster M.D.   On: 04/21/2019 15:53   Dg Chest Port 1 View  Result Date: 04/21/2019 CLINICAL DATA:  Postsurgical follow-up EXAM: PORTABLE CHEST 1 VIEW COMPARISON:  Chest radiograph from one day prior. FINDINGS: Stable left apical chest tube, left perihilar bronchial foul cysts and surgical hardware from ACDF in the lower cervical spine. Stable cardiomediastinal silhouette with normal heart size. No pneumothorax. No pleural effusion. Severe emphysema. No pulmonary edema. Surgical sutures in medial upper left lung. No pulmonary edema. No acute consolidative airspace disease. IMPRESSION: No pneumothorax. Stable left chest tube position. Severe emphysema. Postsurgical changes in the left hemithorax. No acute pulmonary disease. Electronically Signed   By: Ilona Sorrel M.D.   On: 04/21/2019 06:50   Dg Chest Port 1 View  Result Date: 04/20/2019 CLINICAL DATA:  63 year old male with  history of chest tube for pneumothorax. EXAM: PORTABLE CHEST 1 VIEW COMPARISON:  Chest x-ray 04/19/2019. FINDINGS: Left-sided chest tube with tip directed into the apex of the left hemithorax. No appreciable residual left pneumothorax. Numerous bronchial occlude or devices are seen projecting in the left suprahilar region. Mild elevation of the left hemidiaphragm. Right lung is clear. Emphysematous changes in the lungs bilaterally. No pleural effusions. No evidence of pulmonary edema. Heart size is normal. Upper mediastinal contours are within normal limits. Orthopedic fixation hardware in the lower cervical spine incidentally noted. IMPRESSION: 1. Support apparatus, as above. 2. No appreciable left pneumothorax noted on today's examination. 3. Advanced emphysematous changes in the lungs bilaterally again noted. Electronically Signed   By: [name]   On: 04/20/2019 10:03   Dg Chest Port 1 View  Result Date: 04/19/2019 CLINICAL DATA:  Pneumothorax, left chest 2 EXAM: PORTABLE CHEST 1 VIEW COMPARISON:  04/18/2019 FINDINGS: Stable left chest tube position. Postop changes in the left hilum. Endobronchial valves noted. Similar appearance of the left lung base suggesting tiny medial basilar left pneumothorax. No significant interval change. No developing effusion. Lungs are hyperinflated. Background emphysema changes noted. Normal heart size and vascularity.  Trachea is midline. IMPRESSION: Stable left chest tube with findings suggesting persistent small left medial basilar pneumothorax. Stable postoperative findings in the left hilum Stable hyperinflation Electronically Signed   By: Jerilynn Mages.  Shick M.D.   On: 04/19/2019 08:33   Dg Chest Port 1 View  Result Date:  04/18/2019 CLINICAL DATA:  Follow-up left pneumothorax. EXAM: PORTABLE CHEST 1 VIEW COMPARISON:  April 17, 2019 FINDINGS: The left chest tube is stable. There is a probable tiny pneumothorax in the medial left lung base, smaller in the interval.  Bullous changes remain in the right apex. The lungs are otherwise clear. The cardiomediastinal silhouette is unremarkable. Postsurgical changes are seen on the left. IMPRESSION: 1. The left chest tube remains in place. A tiny medial basilar pneumothorax remains, smaller in the interval. No other changes. Continued bullous changes in the right upper lobe. Postsurgical changes on the left. Electronically Signed   By: Dorise Bullion III M.D   On: 04/18/2019 09:06   Dg Chest Port 1 View  Result Date: 04/17/2019 CLINICAL DATA:  Follow-up pneumothorax, status post wedge resection left upper lobe EXAM: PORTABLE CHEST 1 VIEW COMPARISON:  04/17/2019, 04/16/2019, 04/15/2019, 04/14/2019, 04/13/2019 FINDINGS: Postsurgical changes in the cervical spine. Left mid lung pigtail drainage catheter has been removed. Interim insertion of left chest tube with tip at the left apex. Resection of previously noted spiculated opacity/cavitary lesion in the left upper lobe. Postsurgical changes in the left hilar and suprahilar lung. Probable small residual pneumothorax at the medial left lung base. No midline shift. Normal heart size. Bullous emphysematous disease at the right lung apex. IMPRESSION: 1. Interval resection of previously noted spiculated cavitary left upper lobe lesion with placement of new left chest tube with tip at the left apex. Suspect small medial left lung base pneumothorax 2. Emphysematous disease with large bulla in the right upper lobe Electronically Signed   By: Donavan Foil M.D.   On: 04/17/2019 17:34   Dg Chest Port 1 View  Result Date: 04/17/2019 CLINICAL DATA:  Shortness of breath.  Pneumothorax.  Chest tube. EXAM: PORTABLE CHEST 1 VIEW COMPARISON:  One-view chest x-ray 04/16/2019 FINDINGS: Left-sided chest tube is stable in position. Minimal basilar pneumothorax is likely still present. There is no significant change. No significant apically pneumothorax is present. Apical pleural-parenchymal scarring is  stable, left greater than right. Emphysematous changes are noted bilaterally. Heart size is normal. IMPRESSION: 1. Stable chest x-ray with probable tiny basilar pneumothorax. 2. Stable position of chest tube. Electronically Signed   By: San Morelle M.D.   On: 04/17/2019 08:18   Dg Chest Port 1 View  Result Date: 04/16/2019 CLINICAL DATA:  Pneumothorax.  Chest tube. EXAM: PORTABLE CHEST 1 VIEW COMPARISON:  04/15/2019. FINDINGS: Left chest tube noted in stable position. No definite pneumothorax noted on today's exam. Left apical pleural-parenchymal thickening again noted consistent with scarring. COPD. Endobronchial valves again noted in stable position. No pleural effusion. Heart size normal. IMPRESSION: 1. Left chest tube in stable position. No definite pneumothorax noted on today's exam. 2. Left apical pleural-parenchymal thickening again noted consistent scarring. COPD. Electronically Signed   By: Marcello Moores  Register   On: 04/16/2019 06:44   Dg Chest Port 1 View  Result Date: 04/15/2019 CLINICAL DATA:  Left pneumothorax with persistent air leak. Suction reapplied. EXAM: PORTABLE CHEST 1 VIEW COMPARISON:  Numerous priors, most recently same day radiograph FINDINGS: Extensive scarring and architectural distortion in the left lung likely related to prior VATS procedure. Multiple endobronchial valves are noted in the left mid lung. Decrease in the size of the left pneumothorax with small to moderate residual apical component with left chest tube is in position. Hazy bibasilar opacity likely reflect areas of atelectatic change. Cardiomediastinal contours are normal. Cervical hardware is unchanged from prior. Remaining osseous structures and soft tissues  are unremarkable. IMPRESSION: Diminished size of the left pneumothorax with residual small to moderate atypical component. Electronically Signed   By: Lovena Le M.D.   On: 04/15/2019 14:57   Dg Chest Port 1 View  Result Date: 04/15/2019 CLINICAL DATA:   Pneumothorax.  Chest tube. EXAM: PORTABLE CHEST 1 VIEW COMPARISON:  04/14/2019. FINDINGS: Left chest tube in stable position. Prominent left-sided pneumothorax with tension noted on today's exam. Atelectatic changes and pleural-parenchymal thickening consistent scarring again noted in the left lung. Left Intrabronchial valves again noted. COPD again noted. Tiny left pleural effusion. No acute bony abnormality identified. Prior cervicothoracic spine fusion. IMPRESSION: 1. Left chest tube noted in stable position. Prominent left-sided pneumothorax with tension noted on today's exam. Tiny left pleural effusion. 2.  COPD. Critical Value/emergent results were called by telephone at the time of interpretation on 04/15/2019 at 7:02 am to nurse Morene Rankins , who verbally acknowledged these results. Electronically Signed   By: Marcello Moores  Register   On: 04/15/2019 07:11   Dg Chest Port 1 View  Result Date: 04/14/2019 CLINICAL DATA:  63 year old male s/p spontaneous pneumothorax. Postoperative day 4 status post VIDEO BRONCHOSCOPY WITH INSERTION OF INTERBRONCHIAL VALVE (IBV) x 4. EXAM: PORTABLE CHEST 1 VIEW COMPARISON:  A1 20 and earlier. FINDINGS: Portable AP semi upright view at 0815 hours. Stable left side pigtail type chest tube. Multiple left perihilar intrabronchial valves are visible. Architectural distortion in the left lung apex is stable. Trace residual pneumothorax, most apparent along the surface of the left diaphragm and mediastinum. Right upper lobe predominant emphysema with mild architectural distortion. Normal cardiac size and mediastinal contours. Visualized tracheal air column is within normal limits. No acute pulmonary opacity. Negative visible bowel gas pattern. Prior cervical ACDF. IMPRESSION: 1. Stable left chest tube. Trace residual left pneumothorax. 2. Multiple left side intrabronchial valves. Chronic lung disease. No new cardiopulmonary abnormality. Electronically Signed   By: Genevie Ann M.D.   On: 04/14/2019  10:59   Dg Chest Port 1 View  Result Date: 04/13/2019 CLINICAL DATA:  Left-sided pneumothorax follow-up EXAM: PORTABLE CHEST 1 VIEW COMPARISON:  Chest radiograph 04/12/2019 FINDINGS: Monitoring leads overlie the patient. Left chest tube remains in position. Stable cardiac and mediastinal contours. Biapical bullous changes. Stable small left apical and basilar pneumothorax. Redemonstrated cavitary lesion left lung apex. Osseous structures unremarkable. IMPRESSION: Stable small left apical and basilar pneumothorax. Left chest tube remains in place. Apical bullous changes and left apical cavitary lesion. Electronically Signed   By: Lovey Newcomer M.D.   On: 04/13/2019 07:59   Dg Chest Port 1 View  Result Date: 04/12/2019 CLINICAL DATA:  63 year old male with left-sided chest tube in place EXAM: PORTABLE CHEST 1 VIEW COMPARISON:  Prior chest x-ray 04/11/2019 FINDINGS: Stable position of left-sided chest tube. Endobronchial valves remain in stable position. Stable to perhaps slightly improved left apical pneumothorax. Similar degree of biapical pleuroparenchymal scarring and bullous emphysema. Incompletely imaged surgical changes of prior anterior cervicothoracic fusion. No acute osseous abnormality. IMPRESSION: 1. Stable to perhaps slightly improved left apical pneumothorax. 2. Left-sided chest tube remains in unchanged position. Electronically Signed   By: Jacqulynn Cadet M.D.   On: 04/12/2019 08:55   Dg Chest Port 1 View  Result Date: 04/11/2019 CLINICAL DATA:  Chest tube EXAM: PORTABLE CHEST 1 VIEW COMPARISON:  Yesterday FINDINGS: Left lateral small bore chest tube in stable position. Endobronchial valves on the left. Small left apical and basal pneumothorax without convincing change. Emphysema. Normal heart size and mediastinal contours. IMPRESSION: New endobronchial  valves. No convincing change in the small left pneumothorax. Electronically Signed   By: Monte Fantasia M.D.   On: 04/11/2019 08:17   Dg  Chest Port 1 View  Result Date: 04/10/2019 CLINICAL DATA:  Shortness of breath. Chest tube placement for pneumothorax. EXAM: PORTABLE CHEST 1 VIEW COMPARISON:  Radiographs 04/09/2019 and 04/08/2019.  CT 04/06/2019. FINDINGS: 0642 hours. Small caliber pigtail catheter peripherally in the left hemithorax is unchanged in position. The residual left-sided pneumothorax is unchanged with medial basilar and apical components. There is no midline shift. The heart size and mediastinal contours are stable. The lungs are hyperinflated with stable scarring in the left upper lobe. No acute osseous findings are seen. IMPRESSION: Unchanged small left residual pneumothorax following left chest tube placement. No new findings. Electronically Signed   By: Richardean Sale M.D.   On: 04/10/2019 09:33   Dg Chest Port 1 View  Result Date: 04/09/2019 CLINICAL DATA:  History of spontaneous left pneumothorax. EXAM: PORTABLE CHEST 1 VIEW COMPARISON:  Single-view of the chest 04/08/2019. FINDINGS: Left chest tube seen on yesterday's examination is no longer visualized. The tube was essentially outside the chest on yesterday's study. Left pneumothorax seen on yesterday's examination is increased and is now estimated at 30-40%. The lungs are severely emphysematous with distortion of the pulmonary architecture and scarring in the left upper lobe. Heart size is normal. No right pneumothorax. No pleural effusion. IMPRESSION: Increase in the size of a left pneumothorax now estimated at 30-40%. Severe emphysema. These results were called by telephone at the time of interpretation on 04/09/2019 at 8:07 am to the patient's nurse, Sonia Baller , who verbally acknowledged these results. Electronically Signed   By: Inge Rise M.D.   On: 04/09/2019 08:09   Dg Chest Port 1 View  Result Date: 04/08/2019 CLINICAL DATA:  Chest tube with air-leak and pneumothorax EXAM: PORTABLE CHEST 1 VIEW COMPARISON:  04/08/2019, CT chest 04/06/2019, PET CT  10/31/2017 FINDINGS: Left-sided chest tube appears withdrawn with the tip positioned over the left lateral lower chest wall soft tissues and side-port external to the patient. Spiculated opacity in the left apex shows no change. Scattered spiculated opacities in the right apex are unchanged. Emphysematous disease with large bulla. Decreased fluid level at the left base. Residual pneumothorax medially and at the left lung base. Stable cardiomediastinal silhouette. Surgical hardware in the cervical spine. IMPRESSION: 1. Left-sided chest tube appears withdrawn, the tip appears to be positioned over the left lateral lower chest wall soft tissues with side port external to the patient. 2. Decreased fluid level at the left lung base. Small left basilar and medial pneumothorax 3. Emphysematous disease with large apical bulla. No change in spiculated opacity in the left upper lobe. These results will be called to the ordering clinician or representative by the Radiologist Assistant, and communication documented in the PACS or zVision Dashboard. Electronically Signed   By: Donavan Foil M.D.   On: 04/08/2019 23:37    Impression: Solitary pulmonary nodule presenting in the left upper lung.  Clinical history as above.  Patient will be will have repeat imaging at the [facility] in the next several months.  He will also follow-up with his oncologist in the [place] system.   Plan:  Routine follow-up in our department in 6 months.  ____________________________________ -----------------------------------  Blair Promise, PhD, MD  This document serves as a record of services personally performed by Gery Pray, MD. It was created on his behalf by Wilburn Mylar, a trained medical  scribe. The creation of this record is based on the scribe's personal observations and the provider's statements to them. This document has been checked and approved by the attending provider.

## 2019-05-06 NOTE — Patient Instructions (Signed)
Coronavirus (COVID-19) Are you at risk?  Are you at risk for the Coronavirus (COVID-19)?  To be considered HIGH RISK for Coronavirus (COVID-19), you have to meet the following criteria:  . Traveled to [place], [place], [place], [place] or [place]; or in the [place] to [place], [place], [place], or [place]; and have fever, cough, and shortness of breath within the last 2 weeks of travel OR . Been in close contact with a person diagnosed with COVID-19 within the last 2 weeks and have fever, cough, and shortness of breath . IF YOU DO NOT MEET THESE CRITERIA, YOU ARE CONSIDERED LOW RISK FOR COVID-19.  What to do if you are HIGH RISK for COVID-19?  . If you are having a medical emergency, call 911. . Seek medical care right away. Before you go to a doctor's office, urgent care or emergency department, call ahead and tell them about your recent travel, contact with someone diagnosed with COVID-19, and your symptoms. You should receive instructions from your physician's office regarding next steps of care.  . When you arrive at healthcare provider, tell the healthcare staff immediately you have returned from visiting [place], [place], [place], [place] or [place]; or traveled in the [place] to [place], [place], [place], or [place]; in the last two weeks or you have been in close contact with a person diagnosed with COVID-19 in the last 2 weeks.   . Tell the health care staff about your symptoms: fever, cough and shortness of breath. . After you have been seen by a medical provider, you will be either: o Tested for (COVID-19) and discharged home on quarantine except to seek medical care if symptoms worsen, and asked to  - Stay home and avoid contact with others until you get your results (4-5 days)  - Avoid travel on public transportation if possible (such as bus, train, or airplane) or o Sent to the Emergency Department by EMS for evaluation, COVID-19 testing, and possible  admission depending on your condition and test results.  What to do if you are LOW RISK for COVID-19?  Reduce your risk of any infection by using the same precautions used for avoiding the common cold or flu:  . Wash your hands often with soap and warm water for at least 20 seconds.  If soap and water are not readily available, use an alcohol-based hand sanitizer with at least 60% alcohol.  . If coughing or sneezing, cover your mouth and nose by coughing or sneezing into the elbow areas of your shirt or coat, into a tissue or into your sleeve (not your hands). . Avoid shaking hands with others and consider head nods or verbal greetings only. . Avoid touching your eyes, nose, or mouth with unwashed hands.  . Avoid close contact with people who are sick. . Avoid places or events with large numbers of people in one location, like concerts or sporting events. . Carefully consider travel plans you have or are making. . If you are planning any travel outside or inside the [place], visit the CDC's Travelers' Health webpage for the latest health notices. . If you have some symptoms but not all symptoms, continue to monitor at home and seek medical attention if your symptoms worsen. . If you are having a medical emergency, call 911.   ADDITIONAL HEALTHCARE OPTIONS FOR PATIENTS  [facility] / e-Visit: [email or web address]         [facility]: [phone]  [facility]: [phone]                   [facility]: [phone]   

## 2019-05-06 NOTE — Progress Notes (Signed)
Pt presents today for f/u with Dr. Sondra Come. Pt had a left wedge resection on 04/17/19 and reports continued at that site. Pt denies cough, hemoptysis. Pt reports SOB with exertion. Pt denies difficulty swallowing. Pt reports night sweats, every night since surgery. Pt reports ongoing loss of appetite.  BP 131/82 (BP Location: Left Arm, Patient Position: Sitting)   Pulse 100   Temp 98.7 F (37.1 C) (Temporal)   Resp 18   Ht 5\' 5"  (1.651 m)   Wt 97 lb 6 oz (44.2 kg)   SpO2 98%   BMI 16.20 kg/m   Wt Readings from Last 3 Encounters:  05/06/19 97 lb 6 oz (44.2 kg)  05/03/19 100 lb (45.4 kg)  04/17/19 94 lb 12.8 oz (43 kg)   Loma Sousa, RN BSN

## 2019-05-09 ENCOUNTER — Other Ambulatory Visit: Payer: Self-pay | Admitting: *Deleted

## 2019-05-09 ENCOUNTER — Encounter: Payer: Self-pay | Admitting: *Deleted

## 2019-05-09 DIAGNOSIS — J9382 Other air leak: Secondary | ICD-10-CM

## 2019-05-17 ENCOUNTER — Encounter: Payer: Self-pay | Admitting: *Deleted

## 2019-05-23 ENCOUNTER — Other Ambulatory Visit (HOSPITAL_COMMUNITY): Payer: No Typology Code available for payment source

## 2019-05-26 NOTE — Progress Notes (Signed)
[place], [place] - [place] [phone] [place] [address] Phone: [phone] Fax: [phone]      Your procedure is scheduled on May 29, 2019.  Report to [facility] Main Entrance "A" at 09:45 A.M., and check in at the Admitting office.  Call this number if you have problems the morning of surgery:  [phone]  Call [phone] if you have any questions prior to your surgery date Monday-Friday 8am-4pm    Remember:  Do not eat or drink after midnight the night before your surgery    Take these medicines the morning of surgery with A SIP OF WATER : Oxycodone-acetaminophen (Percocet) if needed  7 days prior to surgery STOP taking any Aspirin (unless otherwise instructed by your surgeon), Aleve, Naproxen, Ibuprofen, Motrin, Advil, Goody's, BC's, all herbal medications, fish oil, and all vitamins.    The Morning of Surgery  Do not wear jewelry.  Do not wear lotions, powders, or perfumes/colognes, or deodorant  Do not shave 48 hours prior to surgery.  Men may shave face and neck.  Do not bring valuables to the hospital.  [facility] is not responsible for any belongings or valuables.  If you are a smoker, DO NOT Smoke 24 hours prior to surgery IF you wear a CPAP at night please bring your mask, tubing, and machine the morning of surgery   Remember that you must have someone to transport you home after your surgery, and remain with you for 24 hours if you are discharged the same day.   Contacts, glasses, hearing aids, dentures or bridgework may not be worn into surgery.    Leave your suitcase in the car.  After surgery it may be brought to your room.  For patients admitted to the hospital, discharge time will be determined by your treatment team.  Patients discharged the day of surgery will not be allowed to drive home.    Special instructions:   [facility]- Preparing For  Surgery  Before surgery, you can play an important role. Because skin is not sterile, your skin needs to be as free of germs as possible. You can reduce the number of germs on your skin by washing with CHG (chlorahexidine gluconate) Soap before surgery.  CHG is an antiseptic cleaner which kills germs and bonds with the skin to continue killing germs even after washing.    Oral Hygiene is also important to reduce your risk of infection.  Remember - BRUSH YOUR TEETH THE MORNING OF SURGERY WITH YOUR REGULAR TOOTHPASTE  Please do not use if you have an allergy to CHG or antibacterial soaps. If your skin becomes reddened/irritated stop using the CHG.  Do not shave (including legs and underarms) for at least 48 hours prior to first CHG shower. It is OK to shave your face.  Please follow these instructions carefully.   1. Shower the NIGHT BEFORE SURGERY and the MORNING OF SURGERY with CHG Soap.   2. If you chose to wash your hair, wash your hair first as usual with your normal shampoo.  3. After you shampoo, rinse your hair and body thoroughly to remove the shampoo.  4. Use CHG as you would any other liquid soap. You can apply CHG directly to the skin and wash gently with a scrungie or a clean washcloth.   5. Apply the CHG Soap to your body ONLY FROM THE NECK DOWN.  Do not use on open wounds or open sores.  Avoid contact with your eyes, ears, mouth and genitals (private parts). Wash Face and genitals (private parts)  with your normal soap.   6. Wash thoroughly, paying special attention to the area where your surgery will be performed.  7. Thoroughly rinse your body with warm water from the neck down.  8. DO NOT shower/wash with your normal soap after using and rinsing off the CHG Soap.  9. Pat yourself dry with a CLEAN TOWEL.  10. Wear CLEAN PAJAMAS to bed the night before surgery, wear comfortable clothes the morning of surgery  11. Place CLEAN SHEETS on your bed the night of your first  shower and DO NOT SLEEP WITH PETS.    Day of Surgery:  Do not apply any deodorants/lotions. Please shower the morning of surgery with the CHG soap  Please wear clean clothes to the hospital/surgery center.   Remember to brush your teeth WITH YOUR REGULAR TOOTHPASTE.   Please read over the following fact sheets that you were given.

## 2019-05-27 ENCOUNTER — Ambulatory Visit (HOSPITAL_COMMUNITY)
Admission: RE | Admit: 2019-05-27 | Discharge: 2019-05-27 | Disposition: A | Discharge status: Home / Self Care | Payer: Medicare PPO | Source: Ambulatory Visit | Attending: Thoracic Surgery (Cardiothoracic Vascular Surgery) | Admitting: Thoracic Surgery (Cardiothoracic Vascular Surgery)

## 2019-05-27 ENCOUNTER — Encounter (HOSPITAL_COMMUNITY): Payer: Self-pay

## 2019-05-27 ENCOUNTER — Other Ambulatory Visit: Payer: Self-pay

## 2019-05-27 ENCOUNTER — Encounter (HOSPITAL_COMMUNITY)
Admission: RE | Admit: 2019-05-27 | Discharge: 2019-05-27 | Disposition: A | Discharge status: Home / Self Care | Payer: Medicare PPO | Source: Ambulatory Visit | Attending: Thoracic Surgery (Cardiothoracic Vascular Surgery) | Admitting: Thoracic Surgery (Cardiothoracic Vascular Surgery)

## 2019-05-27 ENCOUNTER — Other Ambulatory Visit (HOSPITAL_COMMUNITY)
Admission: RE | Admit: 2019-05-27 | Discharge: 2019-05-27 | Disposition: A | Discharge status: Home / Self Care | Payer: Medicare PPO | Source: Ambulatory Visit | Attending: Thoracic Surgery (Cardiothoracic Vascular Surgery) | Admitting: Thoracic Surgery (Cardiothoracic Vascular Surgery)

## 2019-05-27 DIAGNOSIS — Z01818 Encounter for other preprocedural examination: Secondary | ICD-10-CM | POA: Diagnosis not present

## 2019-05-27 DIAGNOSIS — Z20828 Contact with and (suspected) exposure to other viral communicable diseases: Secondary | ICD-10-CM | POA: Insufficient documentation

## 2019-05-27 DIAGNOSIS — J9382 Other air leak: Secondary | ICD-10-CM | POA: Insufficient documentation

## 2019-05-27 DIAGNOSIS — Z01812 Encounter for preprocedural laboratory examination: Secondary | ICD-10-CM | POA: Insufficient documentation

## 2019-05-27 LAB — CBC
HCT: 39.4 % (ref 39.0–52.0)
Hemoglobin: 13.1 g/dL (ref 13.0–17.0)
MCH: 33.9 pg (ref 26.0–34.0)
MCHC: 33.2 g/dL (ref 30.0–36.0)
MCV: 102.1 fL — ABNORMAL HIGH (ref 80.0–100.0)
Platelets: 276 10*3/uL (ref 150–400)
RBC: 3.86 MIL/uL — ABNORMAL LOW (ref 4.22–5.81)
RDW: 15.7 % — ABNORMAL HIGH (ref 11.5–15.5)
WBC: 12 10*3/uL — ABNORMAL HIGH (ref 4.0–10.5)
nRBC: 0 % (ref 0.0–0.2)

## 2019-05-27 LAB — COMPREHENSIVE METABOLIC PANEL
ALT: 19 U/L (ref 0–44)
AST: 24 U/L (ref 15–41)
Albumin: 3.6 g/dL (ref 3.5–5.0)
Alkaline Phosphatase: 98 U/L (ref 38–126)
Anion gap: 11 (ref 5–15)
BUN: 13 mg/dL (ref 8–23)
CO2: 22 mmol/L (ref 22–32)
Calcium: 9.1 mg/dL (ref 8.9–10.3)
Chloride: 105 mmol/L (ref 98–111)
Creatinine, Ser: 0.84 mg/dL (ref 0.61–1.24)
GFR calc Af Amer: >60 mL/min (ref >60)
GFR calc non Af Amer: >60 mL/min (ref >60)
Glucose, Bld: 75 mg/dL (ref 70–99)
Potassium: 4.3 mmol/L (ref 3.5–5.1)
Sodium: 138 mmol/L (ref 135–145)
Total Bilirubin: 0.8 mg/dL (ref 0.3–1.2)
Total Protein: 6.9 g/dL (ref 6.5–8.1)

## 2019-05-27 LAB — SURGICAL PCR SCREEN
MRSA, PCR: NEGATIVE
Staphylococcus aureus: NEGATIVE

## 2019-05-27 LAB — PROTIME-INR
INR: 0.9 (ref 0.8–1.2)
Prothrombin Time: 12.4 seconds (ref 11.4–15.2)

## 2019-05-27 LAB — APTT: aPTT: 27 seconds (ref 24–36)

## 2019-05-27 LAB — SARS CORONAVIRUS 2 (TAT 6-24 HRS): SARS Coronavirus 2: NEGATIVE

## 2019-05-27 NOTE — Progress Notes (Signed)
PCP - Dr. Sherral Hammers Cardiologist - denies  Chest x-ray - 05/27/19 EKG - 04/17/19 Stress Test - denies ECHO - denies Cardiac Cath - denies  Sleep Study - denies  Aspirin Instructions: Patient instructed to hold all Aspirin, NSAID's, herbal medications, fish oil and vitamins 7 days prior to surgery.   Anesthesia review:   Patient denies shortness of breath, fever, cough and chest pain at PAT appointment   Patient verbalized understanding of instructions that were given to them at the PAT appointment. Patient was also instructed that they will need to review over the PAT instructions again at home before surgery.

## 2019-05-28 ENCOUNTER — Ambulatory Visit (HOSPITAL_COMMUNITY): Payer: Medicare PPO | Admitting: Vascular Surgery

## 2019-05-28 ENCOUNTER — Ambulatory Visit (HOSPITAL_COMMUNITY): Payer: Medicare PPO | Admitting: Anesthesiology

## 2019-05-28 NOTE — Anesthesia Preprocedure Evaluation (Deleted)
Anesthesia Evaluation  Patient identified by MRN, date of birth, ID band Patient awake    Reviewed: Allergy & Precautions, NPO status , Patient's Chart, lab work & pertinent test results  History of Anesthesia Complications Negative for: history of anesthetic complications  Airway Mallampati: II  TM Distance: >3 FB Neck ROM: Full    Dental  (+) Edentulous Upper, Edentulous Lower   Pulmonary shortness of breath and with exertion, COPD, former smoker,  PTX in 03/2019, s/p bronchoscopy with IBV placement on 04/10/2019, persistent air leak s/p left upper lobe wedge resection 04/17/2019    Pulmonary exam normal        Cardiovascular negative cardio ROS Normal cardiovascular exam     Neuro/Psych negative neurological ROS  negative psych ROS   GI/Hepatic negative GI ROS, Neg liver ROS,   Endo/Other  negative endocrine ROS  Renal/GU negative Renal ROS  negative genitourinary   Musculoskeletal negative musculoskeletal ROS (+)   Abdominal   Peds  Hematology negative hematology ROS (+)   Anesthesia Other Findings Day of surgery medications reviewed with patient.  Reproductive/Obstetrics negative OB ROS                            Anesthesia Physical Anesthesia Plan  ASA: III  Anesthesia Plan: General   Post-op Pain Management:    Induction: Intravenous  PONV Risk Score and Plan: 2 and Treatment may vary due to age or medical condition, Ondansetron, Dexamethasone and Midazolam  Airway Management Planned: Oral ETT  Additional Equipment: None  Intra-op Plan:   Post-operative Plan: Extubation in OR  Informed Consent: I have reviewed the patients History and Physical, chart, labs and discussed the procedure including the risks, benefits and alternatives for the proposed anesthesia with the patient or authorized representative who has indicated his/her understanding and acceptance.     Dental  advisory given  Plan Discussed with: CRNA  Anesthesia Plan Comments:        Anesthesia Quick Evaluation

## 2019-05-29 ENCOUNTER — Ambulatory Visit (HOSPITAL_COMMUNITY)
Admission: RE | Admit: 2019-05-29 | Discharge: 2019-05-29 | Disposition: A | Discharge status: Home / Self Care | Payer: Medicare PPO | Attending: Thoracic Surgery (Cardiothoracic Vascular Surgery) | Admitting: Thoracic Surgery (Cardiothoracic Vascular Surgery)

## 2019-05-29 ENCOUNTER — Encounter (HOSPITAL_COMMUNITY)
Admission: RE | Disposition: A | Discharge status: Home / Self Care | Payer: Self-pay | Source: Home / Self Care | Attending: Thoracic Surgery (Cardiothoracic Vascular Surgery)

## 2019-05-29 ENCOUNTER — Other Ambulatory Visit: Payer: Self-pay

## 2019-05-29 ENCOUNTER — Encounter (HOSPITAL_COMMUNITY): Payer: Self-pay

## 2019-05-29 DIAGNOSIS — Z85118 Personal history of other malignant neoplasm of bronchus and lung: Secondary | ICD-10-CM | POA: Insufficient documentation

## 2019-05-29 DIAGNOSIS — Z5329 Procedure and treatment not carried out because of patient's decision for other reasons: Secondary | ICD-10-CM | POA: Insufficient documentation

## 2019-05-29 DIAGNOSIS — J9382 Other air leak: Secondary | ICD-10-CM

## 2019-05-29 SURGERY — BRONCHOSCOPY, FLEXIBLE, WITH INTRABRONCHIAL VALVE INSERTION
Anesthesia: General

## 2019-05-29 MED ORDER — FENTANYL CITRATE (PF) 250 MCG/5ML IJ SOLN
INTRAMUSCULAR | Status: AC
  Filled 2019-05-29: qty 5

## 2019-05-29 MED ORDER — ONDANSETRON HCL 4 MG/2ML IJ SOLN
INTRAMUSCULAR | Status: AC
  Filled 2019-05-29: qty 2

## 2019-05-29 MED ORDER — EPHEDRINE 5 MG/ML INJ
INTRAVENOUS | Status: AC
  Filled 2019-05-29: qty 10

## 2019-05-29 MED ORDER — ROCURONIUM BROMIDE 10 MG/ML (PF) SYRINGE
PREFILLED_SYRINGE | INTRAVENOUS | Status: AC
  Filled 2019-05-29: qty 10

## 2019-05-29 MED ORDER — PHENYLEPHRINE 40 MCG/ML (10ML) SYRINGE FOR IV PUSH (FOR BLOOD PRESSURE SUPPORT)
PREFILLED_SYRINGE | INTRAVENOUS | Status: AC
  Filled 2019-05-29: qty 10

## 2019-05-29 MED ORDER — LIDOCAINE 2% (20 MG/ML) 5 ML SYRINGE
INTRAMUSCULAR | Status: AC
  Filled 2019-05-29: qty 5

## 2019-05-29 MED ORDER — DEXAMETHASONE SODIUM PHOSPHATE 10 MG/ML IJ SOLN
INTRAMUSCULAR | Status: AC
  Filled 2019-05-29: qty 2

## 2019-05-29 MED ORDER — MIDAZOLAM HCL 2 MG/2ML IJ SOLN
INTRAMUSCULAR | Status: AC
  Filled 2019-05-29: qty 2

## 2019-05-29 MED ORDER — SUCCINYLCHOLINE CHLORIDE 200 MG/10ML IV SOSY
PREFILLED_SYRINGE | INTRAVENOUS | Status: AC
  Filled 2019-05-29: qty 10

## 2019-05-29 MED ORDER — ACETAMINOPHEN 325 MG PO TABS
650.0000 mg | ORAL_TABLET | Freq: Once | ORAL | Status: AC
  Administered 2019-05-29: 650 mg via ORAL
  Filled 2019-05-29: qty 2

## 2019-05-29 MED ORDER — LACTATED RINGERS IV SOLN
INTRAVENOUS | Status: DC
  Administered 2019-05-29: 09:00:00 via INTRAVENOUS

## 2019-05-29 MED ORDER — PROPOFOL 10 MG/ML IV BOLUS
INTRAVENOUS | Status: AC
  Filled 2019-05-29: qty 20

## 2019-05-29 NOTE — Progress Notes (Signed)
Patient asked nurse why his case was delayed and stated "this is ridiculous, unhook me, I'm leaving."  Dr. Roxan Hockey notified.  Patient has agreed to stay.  Patient's wife in lobby and notified of patient's delay in surgery.

## 2019-05-29 NOTE — Progress Notes (Signed)
Patient stated he was not staying any longer.  Dr. Roxan Hockey notified.  IV removed with catheter intact.  Patient ambulated to Main Entrance A and left with wife.

## 2019-05-30 NOTE — H&P (View-Only) (Signed)
Mr. Culpepper left the hospital before I could see him.  Revonda Standard Roxan Hockey, MD [facility] [phone]

## 2019-05-30 NOTE — H&P (Signed)
Dillon Olson left the hospital before I could see him.  Revonda Standard Roxan Hockey, MD [facility] [phone]

## 2019-06-03 ENCOUNTER — Encounter: Payer: Self-pay | Admitting: *Deleted

## 2019-06-03 ENCOUNTER — Other Ambulatory Visit: Payer: Self-pay | Admitting: *Deleted

## 2019-06-03 DIAGNOSIS — J9382 Other air leak: Secondary | ICD-10-CM

## 2019-06-07 NOTE — Pre-Procedure Instructions (Signed)
[place], [place] - [place] [phone] [place] [address] Phone: [phone] Fax: [phone]      Your procedure is scheduled on  06-12-19  Report to [facility] Main Entrance "A" at 0530 A.M., and check in at the Admitting office.  Call this number if you have problems the morning of surgery:  [phone]  Call [phone] if you have any questions prior to your surgery date Monday-Friday 8am-4pm    Remember:  Do not eat or drink after midnight the night before your surgery  Take these medicines the morning of surgery with A SIP OF WATER : oxyCODONE-acetaminophen (PERCOCET)  7 days prior to surgery STOP taking any Aspirin (unless otherwise instructed by your surgeon), Aleve, Naproxen, Ibuprofen, Motrin, Advil, Goody's, BC's, all herbal medications, fish oil, and all vitamins.    The Morning of Surgery  Do not wear jewelry.  Do not wear lotions, powders, or perfumes/colognes, or deodorant    Men may shave face and neck.  Do not bring valuables to the hospital.  [facility] is not responsible for any belongings or valuables.  If you are a smoker, DO NOT Smoke 24 hours prior to surgery IF you wear a CPAP at night please bring your mask, tubing, and machine the morning of surgery   Remember that you must have someone to transport you home after your surgery, and remain with you for 24 hours if you are discharged the same day.   Contacts, glasses, hearing aids, dentures or bridgework may not be worn into surgery.    Leave your suitcase in the car.  After surgery it may be brought to your room.  For patients admitted to the hospital, discharge time will be determined by your treatment team.  Patients discharged the day of surgery will not be allowed to drive home.    Special instructions:   [facility]- Preparing For Surgery  Before surgery, you can play an important role. Because skin  is not sterile, your skin needs to be as free of germs as possible. You can reduce the number of germs on your skin by washing with CHG (chlorahexidine gluconate) Soap before surgery.  CHG is an antiseptic cleaner which kills germs and bonds with the skin to continue killing germs even after washing.    Oral Hygiene is also important to reduce your risk of infection.  Remember - BRUSH YOUR TEETH THE MORNING OF SURGERY WITH YOUR REGULAR TOOTHPASTE  Please do not use if you have an allergy to CHG or antibacterial soaps. If your skin becomes reddened/irritated stop using the CHG.  Do not shave (including legs and underarms) for at least 48 hours prior to first CHG shower. It is OK to shave your face.  Please follow these instructions carefully.   1. Shower the NIGHT BEFORE SURGERY and the MORNING OF SURGERY with CHG Soap.   2. If you chose to wash your hair, wash your hair first as usual with your normal shampoo.  3. After you shampoo, rinse your hair and body thoroughly to remove the shampoo.  4. Use CHG as you would any other liquid soap. You can apply CHG directly to the skin and wash gently with a scrungie or a clean washcloth.   5. Apply the CHG Soap to your body ONLY FROM THE NECK DOWN.  Do not use on open wounds or open sores. Avoid contact with your eyes, ears, mouth and genitals (private parts). Wash  Face and genitals (private parts)  with your normal soap.   6. Wash thoroughly, paying special attention to the area where your surgery will be performed.  7. Thoroughly rinse your body with warm water from the neck down.  8. DO NOT shower/wash with your normal soap after using and rinsing off the CHG Soap.  9. Pat yourself dry with a CLEAN TOWEL.  10. Wear CLEAN PAJAMAS to bed the night before surgery, wear comfortable clothes the morning of surgery  11. Place CLEAN SHEETS on your bed the night of your first shower and DO NOT SLEEP WITH PETS.    Day of Surgery:  Do not apply any  deodorants/lotions. Please shower the morning of surgery with the CHG soap  Please wear clean clothes to the hospital/surgery center.   Remember to brush your teeth WITH YOUR REGULAR TOOTHPASTE.   Please read over the fact sheets that you were given.

## 2019-06-10 ENCOUNTER — Other Ambulatory Visit (HOSPITAL_COMMUNITY)
Admission: RE | Admit: 2019-06-10 | Discharge: 2019-06-10 | Disposition: A | Discharge status: Home / Self Care | Payer: Medicare PPO | Source: Ambulatory Visit | Attending: Thoracic Surgery (Cardiothoracic Vascular Surgery) | Admitting: Thoracic Surgery (Cardiothoracic Vascular Surgery)

## 2019-06-10 ENCOUNTER — Encounter (HOSPITAL_COMMUNITY)
Admission: RE | Admit: 2019-06-10 | Discharge: 2019-06-10 | Disposition: A | Discharge status: Home / Self Care | Payer: Medicare PPO | Source: Ambulatory Visit | Attending: Thoracic Surgery (Cardiothoracic Vascular Surgery) | Admitting: Thoracic Surgery (Cardiothoracic Vascular Surgery)

## 2019-06-10 ENCOUNTER — Encounter (HOSPITAL_COMMUNITY): Payer: Self-pay

## 2019-06-10 ENCOUNTER — Ambulatory Visit (HOSPITAL_COMMUNITY)
Admission: RE | Admit: 2019-06-10 | Discharge: 2019-06-10 | Disposition: A | Discharge status: Home / Self Care | Payer: Medicare PPO | Source: Ambulatory Visit | Attending: Thoracic Surgery (Cardiothoracic Vascular Surgery) | Admitting: Thoracic Surgery (Cardiothoracic Vascular Surgery)

## 2019-06-10 ENCOUNTER — Other Ambulatory Visit: Payer: Self-pay

## 2019-06-10 DIAGNOSIS — J9382 Other air leak: Secondary | ICD-10-CM | POA: Insufficient documentation

## 2019-06-10 DIAGNOSIS — Z20828 Contact with and (suspected) exposure to other viral communicable diseases: Secondary | ICD-10-CM | POA: Diagnosis not present

## 2019-06-10 DIAGNOSIS — Z01818 Encounter for other preprocedural examination: Secondary | ICD-10-CM | POA: Diagnosis not present

## 2019-06-10 LAB — CBC
HCT: 42.7 % (ref 39.0–52.0)
Hemoglobin: 14.3 g/dL (ref 13.0–17.0)
MCH: 33.7 pg (ref 26.0–34.0)
MCHC: 33.5 g/dL (ref 30.0–36.0)
MCV: 100.7 fL — ABNORMAL HIGH (ref 80.0–100.0)
Platelets: 318 10*3/uL (ref 150–400)
RBC: 4.24 MIL/uL (ref 4.22–5.81)
RDW: 14.6 % (ref 11.5–15.5)
WBC: 7.5 10*3/uL (ref 4.0–10.5)
nRBC: 0 % (ref 0.0–0.2)

## 2019-06-10 LAB — COMPREHENSIVE METABOLIC PANEL WITH GFR
ALT: 17 U/L (ref 0–44)
AST: 15 U/L (ref 15–41)
Albumin: 3.7 g/dL (ref 3.5–5.0)
Alkaline Phosphatase: 80 U/L (ref 38–126)
Anion gap: 10 (ref 5–15)
BUN: 7 mg/dL — ABNORMAL LOW (ref 8–23)
CO2: 23 mmol/L (ref 22–32)
Calcium: 9.5 mg/dL (ref 8.9–10.3)
Chloride: 108 mmol/L (ref 98–111)
Creatinine, Ser: 0.83 mg/dL (ref 0.61–1.24)
GFR calc Af Amer: >60 mL/min
GFR calc non Af Amer: >60 mL/min
Glucose, Bld: 80 mg/dL (ref 70–99)
Potassium: 3.7 mmol/L (ref 3.5–5.1)
Sodium: 141 mmol/L (ref 135–145)
Total Bilirubin: 0.6 mg/dL (ref 0.3–1.2)
Total Protein: 7.1 g/dL (ref 6.5–8.1)

## 2019-06-10 LAB — APTT: aPTT: 26 seconds (ref 24–36)

## 2019-06-10 LAB — SARS CORONAVIRUS 2 (TAT 6-24 HRS): SARS Coronavirus 2: NEGATIVE

## 2019-06-10 LAB — PROTIME-INR
INR: 1 (ref 0.8–1.2)
Prothrombin Time: 13.2 s (ref 11.4–15.2)

## 2019-06-10 NOTE — Progress Notes (Signed)
PCP - Dr. Sherral Hammers [facility] [place]) Cardiologist - denies  Chest x-ray - 06-10-19 EKG - n/a  COVID TEST- 06-10-19   Anesthesia review: n/a  Patient denies shortness of breath, fever, cough and chest pain at PAT appointment   Patient verbalized understanding of instructions that were given to them at the PAT appointment. Patient was also instructed that they will need to review over the PAT instructions again at home before surgery.

## 2019-06-11 NOTE — Anesthesia Preprocedure Evaluation (Addendum)
Anesthesia Evaluation  Patient identified by MRN, date of birth, ID band Patient awake    Reviewed: Allergy & Precautions, NPO status , Patient's Chart, lab work & pertinent test results  Airway Mallampati: II  TM Distance: >3 FB Neck ROM: Full    Dental  (+) Dental Advisory Given, Edentulous Lower, Edentulous Upper   Pulmonary COPD, former smoker,  H/o lung cancer PROLONGED AIRLEAK  IBV INSERTION 7/29   Pulmonary exam normal breath sounds clear to auscultation       Cardiovascular negative cardio ROS Normal cardiovascular exam Rhythm:Regular Rate:Normal     Neuro/Psych negative neurological ROS     GI/Hepatic negative GI ROS, Neg liver ROS,   Endo/Other  negative endocrine ROS  Renal/GU negative Renal ROS     Musculoskeletal negative musculoskeletal ROS (+)   Abdominal   Peds  Hematology negative hematology ROS (+)   Anesthesia Other Findings Day of surgery medications reviewed with the patient.  Reproductive/Obstetrics                            Anesthesia Physical Anesthesia Plan  ASA: III  Anesthesia Plan: General   Post-op Pain Management:    Induction: Intravenous  PONV Risk Score and Plan: 2 and Midazolam, Dexamethasone and Ondansetron  Airway Management Planned: Oral ETT  Additional Equipment:   Intra-op Plan:   Post-operative Plan: Extubation in OR  Informed Consent: I have reviewed the patients History and Physical, chart, labs and discussed the procedure including the risks, benefits and alternatives for the proposed anesthesia with the patient or authorized representative who has indicated his/her understanding and acceptance.     Dental advisory given  Plan Discussed with: CRNA  Anesthesia Plan Comments:        Anesthesia Quick Evaluation

## 2019-06-12 ENCOUNTER — Encounter (HOSPITAL_COMMUNITY)
Admission: RE | Disposition: A | Discharge status: Home / Self Care | Payer: Self-pay | Source: Home / Self Care | Attending: Thoracic Surgery (Cardiothoracic Vascular Surgery)

## 2019-06-12 ENCOUNTER — Ambulatory Visit (HOSPITAL_COMMUNITY): Payer: Medicare PPO | Admitting: Certified Registered Nurse Anesthetist

## 2019-06-12 ENCOUNTER — Ambulatory Visit (HOSPITAL_COMMUNITY)
Admission: RE | Admit: 2019-06-12 | Discharge: 2019-06-12 | Disposition: A | Discharge status: Home / Self Care | Payer: Medicare PPO | Attending: Thoracic Surgery (Cardiothoracic Vascular Surgery) | Admitting: Thoracic Surgery (Cardiothoracic Vascular Surgery)

## 2019-06-12 ENCOUNTER — Encounter (HOSPITAL_COMMUNITY): Payer: Self-pay

## 2019-06-12 ENCOUNTER — Ambulatory Visit (HOSPITAL_COMMUNITY): Payer: Medicare PPO

## 2019-06-12 ENCOUNTER — Other Ambulatory Visit: Payer: Self-pay

## 2019-06-12 DIAGNOSIS — J9382 Other air leak: Secondary | ICD-10-CM

## 2019-06-12 DIAGNOSIS — Z85118 Personal history of other malignant neoplasm of bronchus and lung: Secondary | ICD-10-CM | POA: Diagnosis not present

## 2019-06-12 DIAGNOSIS — J449 Chronic obstructive pulmonary disease, unspecified: Secondary | ICD-10-CM | POA: Insufficient documentation

## 2019-06-12 DIAGNOSIS — Z87891 Personal history of nicotine dependence: Secondary | ICD-10-CM | POA: Insufficient documentation

## 2019-06-12 DIAGNOSIS — J9311 Primary spontaneous pneumothorax: Secondary | ICD-10-CM | POA: Diagnosis not present

## 2019-06-12 DIAGNOSIS — Z4689 Encounter for fitting and adjustment of other specified devices: Secondary | ICD-10-CM | POA: Insufficient documentation

## 2019-06-12 HISTORY — PX: VIDEO BRONCHOSCOPY WITH INSERTION OF INTERBRONCHIAL VALVE (IBV): SHX6178

## 2019-06-12 SURGERY — BRONCHOSCOPY, FLEXIBLE, WITH INTRABRONCHIAL VALVE INSERTION
Anesthesia: General

## 2019-06-12 MED ORDER — PROMETHAZINE HCL 25 MG/ML IJ SOLN: 6.2500 mg | INTRAMUSCULAR | Status: DC | PRN

## 2019-06-12 MED ORDER — 0.9 % SODIUM CHLORIDE (POUR BTL) OPTIME
TOPICAL | Status: DC | PRN
  Administered 2019-06-12: 1000 mL

## 2019-06-12 MED ORDER — PROPOFOL 10 MG/ML IV BOLUS
INTRAVENOUS | Status: DC | PRN
  Administered 2019-06-12: 20 mg via INTRAVENOUS
  Administered 2019-06-12: 120 mg via INTRAVENOUS

## 2019-06-12 MED ORDER — LIDOCAINE 2% (20 MG/ML) 5 ML SYRINGE
INTRAMUSCULAR | Status: DC | PRN
  Administered 2019-06-12: 60 mg via INTRAVENOUS

## 2019-06-12 MED ORDER — FENTANYL CITRATE (PF) 100 MCG/2ML IJ SOLN: 25.0000 ug | INTRAMUSCULAR | Status: DC | PRN

## 2019-06-12 MED ORDER — FENTANYL CITRATE (PF) 250 MCG/5ML IJ SOLN
INTRAMUSCULAR | Status: AC
  Filled 2019-06-12: qty 5

## 2019-06-12 MED ORDER — ROCURONIUM BROMIDE 10 MG/ML (PF) SYRINGE
PREFILLED_SYRINGE | INTRAVENOUS | Status: AC
  Filled 2019-06-12: qty 10

## 2019-06-12 MED ORDER — FENTANYL CITRATE (PF) 250 MCG/5ML IJ SOLN
INTRAMUSCULAR | Status: DC | PRN
  Administered 2019-06-12: 100 ug via INTRAVENOUS

## 2019-06-12 MED ORDER — LACTATED RINGERS IV SOLN
INTRAVENOUS | Status: DC | PRN
  Administered 2019-06-12: 07:00:00 via INTRAVENOUS

## 2019-06-12 MED ORDER — ACETAMINOPHEN 500 MG PO TABS
1000.0000 mg | ORAL_TABLET | Freq: Once | ORAL | Status: AC
  Administered 2019-06-12: 1000 mg via ORAL
  Filled 2019-06-12: qty 2

## 2019-06-12 MED ORDER — DEXAMETHASONE SODIUM PHOSPHATE 10 MG/ML IJ SOLN
INTRAMUSCULAR | Status: DC | PRN
  Administered 2019-06-12: 5 mg via INTRAVENOUS

## 2019-06-12 MED ORDER — EPINEPHRINE PF 1 MG/ML IJ SOLN
INTRAMUSCULAR | Status: AC
  Filled 2019-06-12: qty 1

## 2019-06-12 MED ORDER — SUGAMMADEX SODIUM 200 MG/2ML IV SOLN
INTRAVENOUS | Status: DC | PRN
  Administered 2019-06-12: 150 mg via INTRAVENOUS

## 2019-06-12 MED ORDER — PROPOFOL 10 MG/ML IV BOLUS
INTRAVENOUS | Status: AC
  Filled 2019-06-12: qty 40

## 2019-06-12 MED ORDER — ONDANSETRON HCL 4 MG/2ML IJ SOLN
INTRAMUSCULAR | Status: DC | PRN
  Administered 2019-06-12: 4 mg via INTRAVENOUS

## 2019-06-12 MED ORDER — MIDAZOLAM HCL 5 MG/5ML IJ SOLN
INTRAMUSCULAR | Status: DC | PRN
  Administered 2019-06-12 (×2): 1 mg via INTRAVENOUS

## 2019-06-12 MED ORDER — ROCURONIUM BROMIDE 100 MG/10ML IV SOLN
INTRAVENOUS | Status: DC | PRN
  Administered 2019-06-12: 35 mg via INTRAVENOUS
  Administered 2019-06-12: 10 mg via INTRAVENOUS

## 2019-06-12 MED ORDER — MIDAZOLAM HCL 2 MG/2ML IJ SOLN
INTRAMUSCULAR | Status: AC
  Filled 2019-06-12: qty 2

## 2019-06-12 MED ORDER — LIDOCAINE 2% (20 MG/ML) 5 ML SYRINGE
INTRAMUSCULAR | Status: AC
  Filled 2019-06-12: qty 5

## 2019-06-12 SURGICAL SUPPLY — 33 items
ADAPTER VALVE BIOPSY EBUS (MISCELLANEOUS) IMPLANT
ADPTR VALVE BIOPSY EBUS (MISCELLANEOUS)
BLADE CLIPPER SURG (BLADE) ×3 IMPLANT
CANISTER SUCT 3000ML PPV (MISCELLANEOUS) ×3 IMPLANT
CONT SPEC 4OZ CLIKSEAL STRL BL (MISCELLANEOUS) ×3 IMPLANT
COVER BACK TABLE 60X90IN (DRAPES) ×3 IMPLANT
COVER WAND RF STERILE (DRAPES) ×3 IMPLANT
FILTER STRAW FLUID ASPIR (MISCELLANEOUS) IMPLANT
FORCEPS BIOP RJ4 1.8 (CUTTING FORCEPS) ×3 IMPLANT
GAUZE SPONGE 4X4 12PLY STRL (GAUZE/BANDAGES/DRESSINGS) ×3 IMPLANT
GLOVE SURG SIGNA 7.5 PF LTX (GLOVE) ×6 IMPLANT
GOWN STRL REUS W/ TWL XL LVL3 (GOWN DISPOSABLE) ×2 IMPLANT
GOWN STRL REUS W/TWL XL LVL3 (GOWN DISPOSABLE) ×4
KIT CLEAN ENDO COMPLIANCE (KITS) ×3 IMPLANT
KIT TURNOVER KIT B (KITS) ×3 IMPLANT
MARKER SKIN DUAL TIP RULER LAB (MISCELLANEOUS) ×3 IMPLANT
NS IRRIG 1000ML POUR BTL (IV SOLUTION) ×3 IMPLANT
OIL SILICONE PENTAX (PARTS (SERVICE/REPAIRS)) IMPLANT
PAD ARMBOARD 7.5X6 YLW CONV (MISCELLANEOUS) ×6 IMPLANT
STOPCOCK MORSE 400PSI 3WAY (MISCELLANEOUS) ×3 IMPLANT
SYR 10ML LL (SYRINGE) ×3 IMPLANT
SYR 20ML ECCENTRIC (SYRINGE) ×3 IMPLANT
TOWEL GREEN STERILE (TOWEL DISPOSABLE) ×3 IMPLANT
TOWEL GREEN STERILE FF (TOWEL DISPOSABLE) ×3 IMPLANT
TOWEL NATURAL 4PK STERILE (DISPOSABLE) ×3 IMPLANT
TRAP SPECIMEN MUCOUS 40CC (MISCELLANEOUS) ×3 IMPLANT
TUBE CONNECTING 20'X1/4 (TUBING) ×1
TUBE CONNECTING 20X1/4 (TUBING) ×2 IMPLANT
UNDERPAD 30X30 (UNDERPADS AND DIAPERS) ×3 IMPLANT
VALVE BIOPSY  SINGLE USE (MISCELLANEOUS) ×2
VALVE BIOPSY SINGLE USE (MISCELLANEOUS) ×1 IMPLANT
VALVE SUCTION BRONCHIO DISP (MISCELLANEOUS) ×3 IMPLANT
WATER STERILE IRR 1000ML POUR (IV SOLUTION) ×3 IMPLANT

## 2019-06-12 NOTE — Op Note (Signed)
NAME: Dillon Olson, WISSMANN MEDICAL RECORD PN:[id number] ACCOUNT [id number] DATE OF BIRTH:[birth year] FACILITY: [facility] LOCATION: [facility] PHYSICIAN:Alquan Morrish C. Keith Cancio, MD  OPERATIVE REPORT  DATE OF PROCEDURE:  06/12/2019  PREOPERATIVE DIAGNOSIS:  History of pneumothorax with prolonged air leak, status post intrabronchial valve insertion.  POSTOPERATIVE DIAGNOSIS:  History of pneumothorax with prolonged air leak, status post intrabronchial valve insertion.  PROCEDURE:  Video bronchoscopy with removal of intrabronchial valves.  SURGEON:  Modesto Charon, MD  ASSISTANT:  None.  ANESTHESIA:  General.  FINDINGS:  Three valves were easily visible and removed under direct vision.  One valve not visible removed with assistance of fluoroscopy.  Mild bleeding from minimal granulation tissue around the valves.  CLINICAL NOTE:  The patient is a 63 year old gentleman with a history of tobacco abuse and COPD.  He presented back in July with a spontaneous pneumothorax.  Chest tube was placed, but he had a large air leak.  Intrabronchial valves were placed which  decreased, but did not completely resolve the air leak and he ultimately required VATS for stapling of blebs.  He now returns for removal of the intrabronchial valves.  The indications, risks, benefits, and alternatives were discussed in detail with the  patient.  He understood and accepted the risks and agreed to proceed.  DESCRIPTION OF PROCEDURE:  The patient was brought to the operating room on 06/12/2019.  He had induction of general anesthesia and was intubated.  A timeout was performed.  Flexible fiberoptic bronchoscopy was performed via the endotracheal tube.  It  revealed normal endobronchial anatomy with no endobronchial lesions on the right as well as the left lower lobe. In the left upper lobe there were 3 intrabronchial valves that were visible.  The valves in the apical and posterior segmental bronchi were  removed by grasping the  stem of the valve with a biopsy forceps and exerting gentle traction.  The valves were removed intact without difficulty.  The anterior segmental valve was not visible.  A 2 mm scope was placed and the stem of the valve still  could not be visualized.  The regular bronchoscope was replaced.  The valve in the lingular segmental bronchus was grasped and removed without difficulty.  Fluoroscopy then was used to remove the anterior segmental bronchus valve.  The total fluoroscopy  time was 0.6 minutes.  The total dose was 3 mGy.  There was some mild blood oozing from granulation tissue.  This cleared with saline and there was no ongoing bleeding.  The bronchoscope was removed.  The patient was extubated in the operating room and  taken to the [facility] in good condition.  TN/NUANCE  D:06/12/2019 T:06/12/2019 JOB:[id number]

## 2019-06-12 NOTE — Interval H&P Note (Signed)
History and Physical Interval Note:  06/12/2019 7:13 AM  Dillon Olson  has presented today for surgery, with the diagnosis of PROLONGED AIRLEAK IBV INSERTION 7/29.  The various methods of treatment have been discussed with the patient and family. After consideration of risks, benefits and other options for treatment, the patient has consented to  Procedure(s): VIDEO BRONCHOSCOPY WITH REMOVAL OF INTERBRONCHIAL VALVE (IBV) (N/A) as a surgical intervention.  The patient's history has been reviewed, patient examined, no change in status, stable for surgery.  I have reviewed the patient's chart and labs.  Questions were answered to the patient's satisfaction.     Melrose Nakayama

## 2019-06-12 NOTE — Anesthesia Procedure Notes (Signed)
Procedure Name: Intubation Date/Time: 06/12/2019 7:33 AM Performed by: Janene Harvey, CRNA Pre-anesthesia Checklist: Patient identified, Emergency Drugs available, Suction available and Patient being monitored Patient Re-evaluated:Patient Re-evaluated prior to induction Oxygen Delivery Method: Circle system utilized Preoxygenation: Pre-oxygenation with 100% oxygen Induction Type: IV induction Ventilation: Mask ventilation without difficulty Laryngoscope Size: Mac and 4 Grade View: Grade I Tube type: Oral Tube size: 9.0 mm Number of attempts: 1 Airway Equipment and Method: Stylet and Oral airway Placement Confirmation: ETT inserted through vocal cords under direct vision,  positive ETCO2 and breath sounds checked- equal and bilateral Secured at: 22 cm Tube secured with: Tape Dental Injury: Teeth and Oropharynx as per pre-operative assessment

## 2019-06-12 NOTE — OR Nursing (Signed)
Procedure completed, per Dr. Modesto Charon give IBV values to patient post procedure. IBV values transported to PACU with patient and given to PACU RN to give to patient.

## 2019-06-12 NOTE — Anesthesia Postprocedure Evaluation (Signed)
Anesthesia Post Note  Patient: Dillon Olson  Procedure(s) Performed: VIDEO BRONCHOSCOPY WITH REMOVAL OF INTERBRONCHIAL VALVE (IBV) (N/A )     Patient location during evaluation: PACU Anesthesia Type: General Level of consciousness: awake and alert, awake and oriented Pain management: pain level controlled Vital Signs Assessment: post-procedure vital signs reviewed and stable Respiratory status: spontaneous breathing, nonlabored ventilation, respiratory function stable and patient connected to nasal cannula oxygen Cardiovascular status: blood pressure returned to baseline and stable Postop Assessment: no apparent nausea or vomiting Anesthetic complications: no    Last Vitals:  Vitals:   06/12/19 0900 06/12/19 0901  BP: 138/81 138/81  Pulse: 83 78  Resp: 18 16  Temp: 36.5 C   SpO2: 95% 96%    Last Pain:  Vitals:   06/12/19 0900  TempSrc:   PainSc: 0-No pain                 Catalina Gravel

## 2019-06-12 NOTE — Brief Op Note (Addendum)
06/12/2019  8:39 AM  PATIENT:  Dillon Olson  63 y.o. male  PRE-OPERATIVE DIAGNOSIS:  PROLONGED AIRLEAK IBV INSERTION 7/29  POST-OPERATIVE DIAGNOSIS:  PROLONGED AIRLEAK IBV INSERTION 7/29  PROCEDURE:  Procedure(s): VIDEO BRONCHOSCOPY WITH REMOVAL OF INTERBRONCHIAL VALVE (IBV) (N/A)  SURGEON:  Surgeon(s) and Role:    * Melrose Nakayama, MD - Primary  PHYSICIAN ASSISTANT:   ASSISTANTS: none   ANESTHESIA:   general  EBL:  minimal  BLOOD ADMINISTERED:none  DRAINS: none   LOCAL MEDICATIONS USED:  NONE  SPECIMEN:  IBV valves x 4  DISPOSITION OF SPECIMEN:  N/A  COUNTS:  NO endoscopic  TOURNIQUET:  * No tourniquets in log *  DICTATION: .Other Dictation: Dictation Number -#[id number]  PLAN OF CARE: Discharge to home after PACU  PATIENT DISPOSITION:  PACU - hemodynamically stable.   Delay start of Pharmacological VTE agent (>24hrs) due to surgical blood loss or risk of bleeding: not applicable

## 2019-06-12 NOTE — Discharge Instructions (Signed)
Do not drive or engage in heavy physical activity for 24 hours  You may resume normal activities tomorrow  You may cough up small amounts of blood over the next few days  Call [phone] if you develop chest pain, shortness of breath or cough up more than 2 tablespoons of blood

## 2019-06-12 NOTE — Transfer of Care (Signed)
Immediate Anesthesia Transfer of Care Note  Patient: Dillon Olson  Procedure(s) Performed: VIDEO BRONCHOSCOPY WITH REMOVAL OF INTERBRONCHIAL VALVE (IBV) (N/A )  Patient Location: PACU  Anesthesia Type:General  Level of Consciousness: awake  Airway & Oxygen Therapy: Patient Spontanous Breathing and Patient connected to face mask oxygen  Post-op Assessment: Report given to RN and Post -op Vital signs reviewed and stable  Post vital signs: Reviewed  Last Vitals:  Vitals Value Taken Time  BP 130/76 06/12/19 0831  Temp    Pulse 86 06/12/19 0832  Resp 17 06/12/19 0832  SpO2 95 % 06/12/19 0832  Vitals shown include unvalidated device data.  Last Pain:  Vitals:   06/12/19 0557  TempSrc:   PainSc: 0-No pain      Patients Stated Pain Goal: 2 (88/41/66 0630)  Complications: No apparent anesthesia complications

## 2019-06-13 ENCOUNTER — Encounter (HOSPITAL_COMMUNITY): Payer: Self-pay | Admitting: Thoracic Surgery (Cardiothoracic Vascular Surgery)

## 2019-11-07 ENCOUNTER — Ambulatory Visit
Admission: RE | Admit: 2019-11-07 | Discharge: 2019-11-07 | Disposition: A | Discharge status: Home / Self Care | Payer: No Typology Code available for payment source | Source: Ambulatory Visit | Attending: Radiation Oncology | Admitting: Radiation Oncology

## 2020-03-17 ENCOUNTER — Emergency Department (HOSPITAL_COMMUNITY)
Admission: EM | Admit: 2020-03-17 | Discharge: 2020-03-17 | Disposition: A | Discharge status: Home / Self Care | Payer: No Typology Code available for payment source | Attending: Emergency Medicine | Admitting: Emergency Medicine

## 2020-03-17 ENCOUNTER — Encounter (HOSPITAL_COMMUNITY): Payer: Self-pay | Admitting: Emergency Medicine

## 2020-03-17 ENCOUNTER — Other Ambulatory Visit: Payer: Self-pay

## 2020-03-17 ENCOUNTER — Emergency Department (HOSPITAL_COMMUNITY): Payer: No Typology Code available for payment source

## 2020-03-17 DIAGNOSIS — J449 Chronic obstructive pulmonary disease, unspecified: Secondary | ICD-10-CM | POA: Diagnosis not present

## 2020-03-17 DIAGNOSIS — U071 COVID-19: Secondary | ICD-10-CM | POA: Insufficient documentation

## 2020-03-17 DIAGNOSIS — R109 Unspecified abdominal pain: Secondary | ICD-10-CM | POA: Diagnosis not present

## 2020-03-17 DIAGNOSIS — R197 Diarrhea, unspecified: Secondary | ICD-10-CM

## 2020-03-17 DIAGNOSIS — Z79899 Other long term (current) drug therapy: Secondary | ICD-10-CM | POA: Diagnosis not present

## 2020-03-17 DIAGNOSIS — Z87891 Personal history of nicotine dependence: Secondary | ICD-10-CM | POA: Diagnosis not present

## 2020-03-17 DIAGNOSIS — R112 Nausea with vomiting, unspecified: Secondary | ICD-10-CM | POA: Diagnosis present

## 2020-03-17 LAB — URINALYSIS, ROUTINE W REFLEX MICROSCOPIC
Bilirubin Urine: NEGATIVE
Glucose, UA: NEGATIVE mg/dL
Ketones, ur: 80 mg/dL — AB
Leukocytes,Ua: NEGATIVE
Nitrite: NEGATIVE
Protein, ur: 30 mg/dL — AB
Specific Gravity, Urine: >1.046 — ABNORMAL HIGH (ref 1.005–1.030)
pH: 5 (ref 5.0–8.0)

## 2020-03-17 LAB — COMPREHENSIVE METABOLIC PANEL
ALT: 17 U/L (ref 0–44)
AST: 22 U/L (ref 15–41)
Albumin: 3.7 g/dL (ref 3.5–5.0)
Alkaline Phosphatase: 94 U/L (ref 38–126)
Anion gap: 20 — ABNORMAL HIGH (ref 5–15)
BUN: 13 mg/dL (ref 8–23)
CO2: 21 mmol/L — ABNORMAL LOW (ref 22–32)
Calcium: 9.4 mg/dL (ref 8.9–10.3)
Chloride: 98 mmol/L (ref 98–111)
Creatinine, Ser: 1.03 mg/dL (ref 0.61–1.24)
GFR calc Af Amer: >60 mL/min (ref >60)
GFR calc non Af Amer: >60 mL/min (ref >60)
Glucose, Bld: 116 mg/dL — ABNORMAL HIGH (ref 70–99)
Potassium: 4.1 mmol/L (ref 3.5–5.1)
Sodium: 139 mmol/L (ref 135–145)
Total Bilirubin: 1.2 mg/dL (ref 0.3–1.2)
Total Protein: 7.7 g/dL (ref 6.5–8.1)

## 2020-03-17 LAB — CBC
HCT: 45.5 % (ref 39.0–52.0)
Hemoglobin: 15.1 g/dL (ref 13.0–17.0)
MCH: 31.5 pg (ref 26.0–34.0)
MCHC: 33.2 g/dL (ref 30.0–36.0)
MCV: 94.8 fL (ref 80.0–100.0)
Platelets: 389 10*3/uL (ref 150–400)
RBC: 4.8 MIL/uL (ref 4.22–5.81)
RDW: 13.5 % (ref 11.5–15.5)
WBC: 5.5 10*3/uL (ref 4.0–10.5)
nRBC: 0 % (ref 0.0–0.2)

## 2020-03-17 LAB — LIPASE, BLOOD: Lipase: 128 U/L — ABNORMAL HIGH (ref 11–51)

## 2020-03-17 LAB — SARS CORONAVIRUS 2 BY RT PCR (HOSPITAL ORDER, PERFORMED IN ~~LOC~~ HOSPITAL LAB): SARS Coronavirus 2: POSITIVE — AB

## 2020-03-17 LAB — MAGNESIUM: Magnesium: 1.9 mg/dL (ref 1.7–2.4)

## 2020-03-17 MED ORDER — PROMETHAZINE HCL 25 MG/ML IJ SOLN
25.0000 mg | Freq: Once | INTRAMUSCULAR | Status: AC
  Administered 2020-03-17: 25 mg via INTRAVENOUS
  Filled 2020-03-17: qty 1

## 2020-03-17 MED ORDER — ONDANSETRON HCL 4 MG/2ML IJ SOLN
4.0000 mg | Freq: Once | INTRAMUSCULAR | Status: AC
  Administered 2020-03-17: 4 mg via INTRAVENOUS
  Filled 2020-03-17: qty 2

## 2020-03-17 MED ORDER — IOHEXOL 300 MG/ML  SOLN
100.0000 mL | Freq: Once | INTRAMUSCULAR | Status: AC | PRN
  Administered 2020-03-17: 100 mL via INTRAVENOUS

## 2020-03-17 MED ORDER — SODIUM CHLORIDE 0.9% FLUSH
3.0000 mL | Freq: Once | INTRAVENOUS | Status: AC
  Administered 2020-03-17: 3 mL via INTRAVENOUS

## 2020-03-17 MED ORDER — MORPHINE SULFATE (PF) 4 MG/ML IV SOLN
4.0000 mg | Freq: Once | INTRAVENOUS | Status: AC
  Administered 2020-03-17: 4 mg via INTRAVENOUS
  Filled 2020-03-17: qty 1

## 2020-03-17 MED ORDER — ONDANSETRON HCL 4 MG PO TABS: 4.0000 mg | ORAL_TABLET | Freq: Four times a day (QID) | ORAL | 0 refills | Status: AC

## 2020-03-17 MED ORDER — SODIUM CHLORIDE 0.9 % IV BOLUS (SEPSIS)
1000.0000 mL | Freq: Once | INTRAVENOUS | Status: AC
  Administered 2020-03-17: 1000 mL via INTRAVENOUS

## 2020-03-17 NOTE — ED Triage Notes (Signed)
Pt reports wife recently diagnosed with covid, reports he has been sick with n/v/d x2 weeks.

## 2020-03-17 NOTE — ED Notes (Signed)
Pt given ginger ale.

## 2020-03-17 NOTE — ED Provider Notes (Signed)
[facility] EMERGENCY DEPARTMENT Provider Note   CSN: [id number] Arrival date & time: 03/17/20  1118     History Chief Complaint  Patient presents with  . Emesis    Dillon Olson is a 64 y.o. male.  Patient is a 64 year old male with past medical history of COPD, lung cancer, pneumothorax presenting to the emergency department for nausea, vomiting and body aches for the past 2 weeks.  Reports that his wife is at home sick with COVID-19.  He reports vomiting every day and also having some loose stools.  He denies any syncope, lightheadedness, dizziness, cough, chest pain, shortness of breath.        Past Medical History:  Diagnosis Date  . Cancer ([place])   . COPD (chronic obstructive pulmonary disease) ([place])   . History of radiation therapy 08/29/16, 08/31/16, 09/02/16   Left upper lung / 54 Gy in 3 fractions  . Lung nodule   . Pneumothorax 03/2019   LEFT    Patient Active Problem List   Diagnosis Date Noted  . Encounter for removal of sutures 04/29/2019  . Protein-calorie malnutrition, severe 04/16/2019  . COPD with emphysema/Bleps 04/07/2019  . FTT (failure to thrive) in adult/left lung cancer related 04/07/2019  . Pneumothorax on left 04/06/2019  . Solitary pulmonary nodule/presumed left lung cancer-status post prior radiation therapy 07/07/2016    Past Surgical History:  Procedure Laterality Date  . APPENDECTOMY    . CHEST TUBE INSERTION  1981   collapsed left lung  . CHEST TUBE INSERTION Left 04/08/2019  . HERNIA REPAIR     x3  . lower back     L1 and L5  . neck fusion    . VIDEO ASSISTED THORACOSCOPY (VATS) W/TALC PLEUADESIS Left 04/17/2019   Procedure: Video Assisted Thoracoscopy (Vats) W Talc Pleuradesis;  Surgeon: Wonda Olds, MD;  Location: [facility] OR;  Service: Thoracic;  Laterality: Left;  Marland Kitchen VIDEO BRONCHOSCOPY WITH INSERTION OF INTERBRONCHIAL VALVE (IBV) N/A 04/10/2019   Procedure: VIDEO BRONCHOSCOPY WITH INSERTION OF INTERBRONCHIAL  VALVE (IBV);  Surgeon: Melrose Nakayama, MD;  Location: [facility] OR;  Service: Thoracic;  Laterality: N/A;  . VIDEO BRONCHOSCOPY WITH INSERTION OF INTERBRONCHIAL VALVE (IBV) N/A 06/12/2019   Procedure: VIDEO BRONCHOSCOPY WITH REMOVAL OF INTERBRONCHIAL VALVE (IBV);  Surgeon: Melrose Nakayama, MD;  Location: [facility];  Service: Thoracic;  Laterality: N/A;  . WEDGE RESECTION Left 04/17/2019   Procedure: Wedge Resection of Left Upper Lobe;  Surgeon: Wonda Olds, MD;  Location: [facility] OR;  Service: Thoracic;  Laterality: Left;       Family History  Problem Relation Age of Onset  . Lung cancer Father     Social History   Tobacco Use  . Smoking status: Former Smoker    Packs/day: 0.75    Types: Cigarettes    Start date: 69    Quit date: 07/07/2014    Years since quitting: 5.6  . Smokeless tobacco: Never Used  Vaping Use  . Vaping Use: Never used  Substance Use Topics  . Alcohol use: No  . Drug use: No    Home Medications Prior to Admission medications   Medication Sig Start Date End Date Taking? Authorizing Provider  cyclobenzaprine (FLEXERIL) 5 MG tablet Take 5 mg by mouth daily as needed for muscle spasms.    Yes [provider]  oxyCODONE-acetaminophen (PERCOCET) 10-325 MG tablet Take 1 tablet by mouth every 6 (six) hours.    Yes [provider]  ENSURE  PLUS (ENSURE PLUS) LIQD Take 237 mLs by mouth 2 (two) times daily between meals.  Patient not taking: Reported on 03/17/2020    [provider]  megestrol (MEGACE) 20 MG tablet Take 20 mg by mouth daily. Patient not taking: Reported on 03/17/2020    [provider]  ondansetron (ZOFRAN) 4 MG tablet Take 1 tablet (4 mg total) by mouth every 6 (six) hours. 03/17/20   Alveria Apley, PA-C    Allergies    Advil [ibuprofen] and Codeine  Review of Systems   Review of Systems  Constitutional: Positive for appetite change and fatigue. Negative for fever.  HENT: Negative.  Negative for congestion.     Respiratory: Negative for cough and shortness of breath.   Cardiovascular: Negative for chest pain.  Gastrointestinal: Positive for diarrhea and vomiting. Negative for abdominal distention, abdominal pain and nausea.  Genitourinary: Negative for dysuria.  Musculoskeletal: Negative for back pain.  Skin: Negative for rash.  Neurological: Negative for dizziness, light-headedness and headaches.  All other systems reviewed and are negative.   Physical Exam Updated Vital Signs BP (!) 115/56   Pulse 78   Temp 98.1 F (36.7 C) (Oral)   Resp 16   Wt 48 kg   SpO2 97%   BMI 17.61 kg/m   Physical Exam Vitals and nursing note reviewed.  Constitutional:      General: He is not in acute distress.    Appearance: Normal appearance. He is not toxic-appearing or diaphoretic.     Comments: Patient appears chronically ill, cachectic  HENT:     Head: Normocephalic and atraumatic. No raccoon eyes, Battle's sign, abrasion, contusion, masses or laceration. Hair is normal.     Nose: Nose normal. No congestion or rhinorrhea.     Mouth/Throat:     Mouth: Mucous membranes are moist.  Eyes:     Conjunctiva/sclera: Conjunctivae normal.     Pupils: Pupils are equal, round, and reactive to light.  Cardiovascular:     Rate and Rhythm: Normal rate and regular rhythm.  Pulmonary:     Effort: Pulmonary effort is normal.     Breath sounds: Normal breath sounds.  Abdominal:     General: Abdomen is flat. Bowel sounds are normal.     Tenderness: There is abdominal tenderness (epigastic). There is no guarding.  Musculoskeletal:        General: Normal range of motion.     Cervical back: Full passive range of motion without pain. No signs of trauma, torticollis or crepitus. No pain with movement or spinous process tenderness. Normal range of motion.     Right lower leg: No edema.     Left lower leg: No edema.  Skin:    General: Skin is warm and dry.  Neurological:     General: No focal deficit present.      Mental Status: He is alert and oriented to person, place, and time.     Sensory: No sensory deficit.     Motor: No weakness.     Gait: Gait normal.  Psychiatric:        Mood and Affect: Mood normal.     ED Results / Procedures / Treatments   Labs (all labs ordered are listed, but only abnormal results are displayed) Labs Reviewed  SARS CORONAVIRUS 2 BY RT PCR (HOSPITAL ORDER, [facility]) - Abnormal; Notable for the following components:      Result Value   SARS Coronavirus 2 POSITIVE (*)  All other components within normal limits  LIPASE, BLOOD - Abnormal; Notable for the following components:   Lipase 128 (*)    All other components within normal limits  COMPREHENSIVE METABOLIC PANEL - Abnormal; Notable for the following components:   CO2 21 (*)    Glucose, Bld 116 (*)    Anion gap 20 (*)    All other components within normal limits  URINALYSIS, ROUTINE W REFLEX MICROSCOPIC - Abnormal; Notable for the following components:   Specific Gravity, Urine >1.046 (*)    Hgb urine dipstick SMALL (*)    Ketones, ur 80 (*)    Protein, ur 30 (*)    Bacteria, UA RARE (*)    All other components within normal limits  URINE CULTURE  CBC  MAGNESIUM    EKG None  Radiology CT ABDOMEN PELVIS W CONTRAST  Result Date: 03/17/2020 CLINICAL DATA:  Abdominal pain, recent COVID diagnosis with nausea vomiting and diarrhea EXAM: CT ABDOMEN AND PELVIS WITH CONTRAST TECHNIQUE: Multidetector CT imaging of the abdomen and pelvis was performed using the standard protocol following bolus administration of intravenous contrast. CONTRAST:  147mL OMNIPAQUE IOHEXOL 300 MG/ML  SOLN COMPARISON:  PET exam from 2019 FINDINGS: Lower chest: Basilar opacities at the periphery with some septal thickening and ground-glass. No dense consolidation. No sign of pleural effusion. Hepatobiliary: Liver normal in size and contour. Small low-density lesion in the anterior LEFT hemi liver likely a  cyst unchanged from previous imaging. Also small low-density lesion in the RIGHT hemi liver less than a cm with similar appearance. Also unchanged. Hepatic veins are patent. Portal veins are patent. No pericholecystic stranding.  No biliary ductal dilation. Pancreas: Pancreas without signs of inflammation, ductal dilation or focal lesion. Spleen: Spleen normal size and contour. Adrenals/Urinary Tract: Adrenal glands are normal. Renal contours are smooth. No hydronephrosis. Urinary bladder is normal. Stomach/Bowel: Stomach with under distension, limiting assessment. No acute small bowel process. Mild distension of the duodenum may be due to peristaltic activity. Some narrowing of the duodenum passing beneath the SMA but without significant gastric distension, also area appears unchanged with respect to configuration of SMA and SMV relationships dating back to 2019. Colon is under distended. No pericolonic stranding. Question mild thickening distally but the level of collapse in this area does limit assessment. Signs of colonic diverticulosis mainly in the sigmoid colon. Vascular/Lymphatic: Calcific and noncalcific atheromatous plaque of the abdominal aorta. No aneurysmal dilation. No adenopathy in the retroperitoneum or in the upper abdomen. No signs of pelvic adenopathy. Reproductive: Calcifications in the prostate, nonspecific finding. Other: No ascites.  No pneumo peritoneum. Musculoskeletal: No acute musculoskeletal process. No destructive bone finding. IMPRESSION: 1. Basilar opacities with some septal thickening and ground-glass. Findings are nonspecific but could be seen in the setting of COVID-19 pneumonia or sequela of previous infection, if there are continued respiratory symptoms follow-up high-resolution chest CT could be considered. 2. No specific findings to suggest acute abdominal process. Scattered loops of fluid-filled small bowel are present and there is a question of very mild colonic thickening.  Findings could be seen in the setting of gastroenteritis. Correlate also with any symptoms of mild colitis. 3. Aortic atherosclerosis. Aortic Atherosclerosis (ICD10-I70.0). Electronically Signed   By: Zetta Bills M.D.   On: 03/17/2020 14:28    Procedures Procedures (including critical care time)  Medications Ordered in ED Medications  sodium chloride flush (NS) 0.9 % injection 3 mL (3 mLs Intravenous Given 03/17/20 1316)  ondansetron (ZOFRAN) injection 4 mg (4 mg  Intravenous Given 03/17/20 1316)  sodium chloride 0.9 % bolus 1,000 mL (0 mLs Intravenous Stopped 03/17/20 1632)  iohexol (OMNIPAQUE) 300 MG/ML solution 100 mL (100 mLs Intravenous Contrast Given 03/17/20 1353)  promethazine (PHENERGAN) injection 25 mg (25 mg Intravenous Given 03/17/20 1504)  morphine 4 MG/ML injection 4 mg (4 mg Intravenous Given 03/17/20 1537)  morphine 4 MG/ML injection 4 mg (4 mg Intravenous Given 03/17/20 1657)  ondansetron (ZOFRAN) injection 4 mg (4 mg Intravenous Given 03/17/20 1657)    ED Course  I have reviewed the triage vital signs and the nursing notes.  Pertinent labs & imaging results that were available during my care of the patient were reviewed by me and considered in my medical decision making (see chart for details).  Clinical Course as of Mar 18 1731  Tue Mar 17, 2020  1546 Patient presenting with n/v/d for 2 weeks with covid exposure. Patient afebrile with normal vitals here but he does have active vomiting. Labs reassuring without significant electrolyte disturbances. He does appear dehydrated and is getting V fluids, antiemetics and pain medication.    [KM]  4037 Patient is improved after fluids, antiemetics and pain medication.  Discussed admission vs outpatient tx for patient. Patient reports that he is okay for discharge home.  Advised on return precautions.   He is not hypoxic and is tolerating p.o. at this point.  Not orthostatic and appears well.  His symptoms started 2 weeks ago and therefore he  is not a candidate for other Covid therapies.  Will treat symptomatically.    [KM]    Clinical Course User Index [KM] Kristine Royal   MDM Rules/Calculators/A&P                          Based on review of vitals, medical screening exam, lab work and/or imaging, there does not appear to be an acute, emergent etiology for the patient's symptoms. Counseled pt on good return precautions and encouraged both PCP and ED follow-up as needed.  Prior to discharge, I also discussed incidental imaging findings with patient in detail and advised appropriate, recommended follow-up in detail.  Clinical Impression: 1. COVID-19   2. Nausea vomiting and diarrhea     Disposition: Discharge  Prior to providing a prescription for a controlled substance, I independently reviewed the patient's recent prescription history on the [place]. The patient had no recent or regular prescriptions and was deemed appropriate for a brief, less than 3 day prescription of narcotic for acute analgesia.  This note was prepared with assistance of Systems analyst. Occasional wrong-word or sound-a-like substitutions may have occurred due to the inherent limitations of voice recognition software.  Final Clinical Impression(s) / ED Diagnoses Final diagnoses:  COVID-19  Nausea vomiting and diarrhea    Rx / DC Orders ED Discharge Orders         Ordered    ondansetron (ZOFRAN) 4 MG tablet  Every 6 hours     Discontinue  Reprint     03/17/20 1723           Alveria Apley, PA-C 03/17/20 1732    Quintella Reichert, MD 03/21/20 [phone]

## 2020-03-17 NOTE — ED Notes (Signed)
Patient transported to CT 

## 2020-03-17 NOTE — ED Notes (Signed)
Pt given water but still c.o nausea, EDP aware, see MAR

## 2020-03-17 NOTE — Discharge Instructions (Signed)
Please drink plenty of fluids. Thank you for allowing me to care for you today. Please return to the emergency department if you have new or worsening symptoms. Take your medications as instructed.

## 2020-03-18 LAB — URINE CULTURE: Culture: NO GROWTH

## 2021-02-05 IMAGING — DX CHEST - 2 VIEW
2 series · 2 of 2 positions shown · non-contrast
Comparison: 04/06/2019

CLINICAL DATA: Left pneumothorax with a left-sided chest tube.

EXAM:
CHEST - 2 VIEW

[w chest pa]
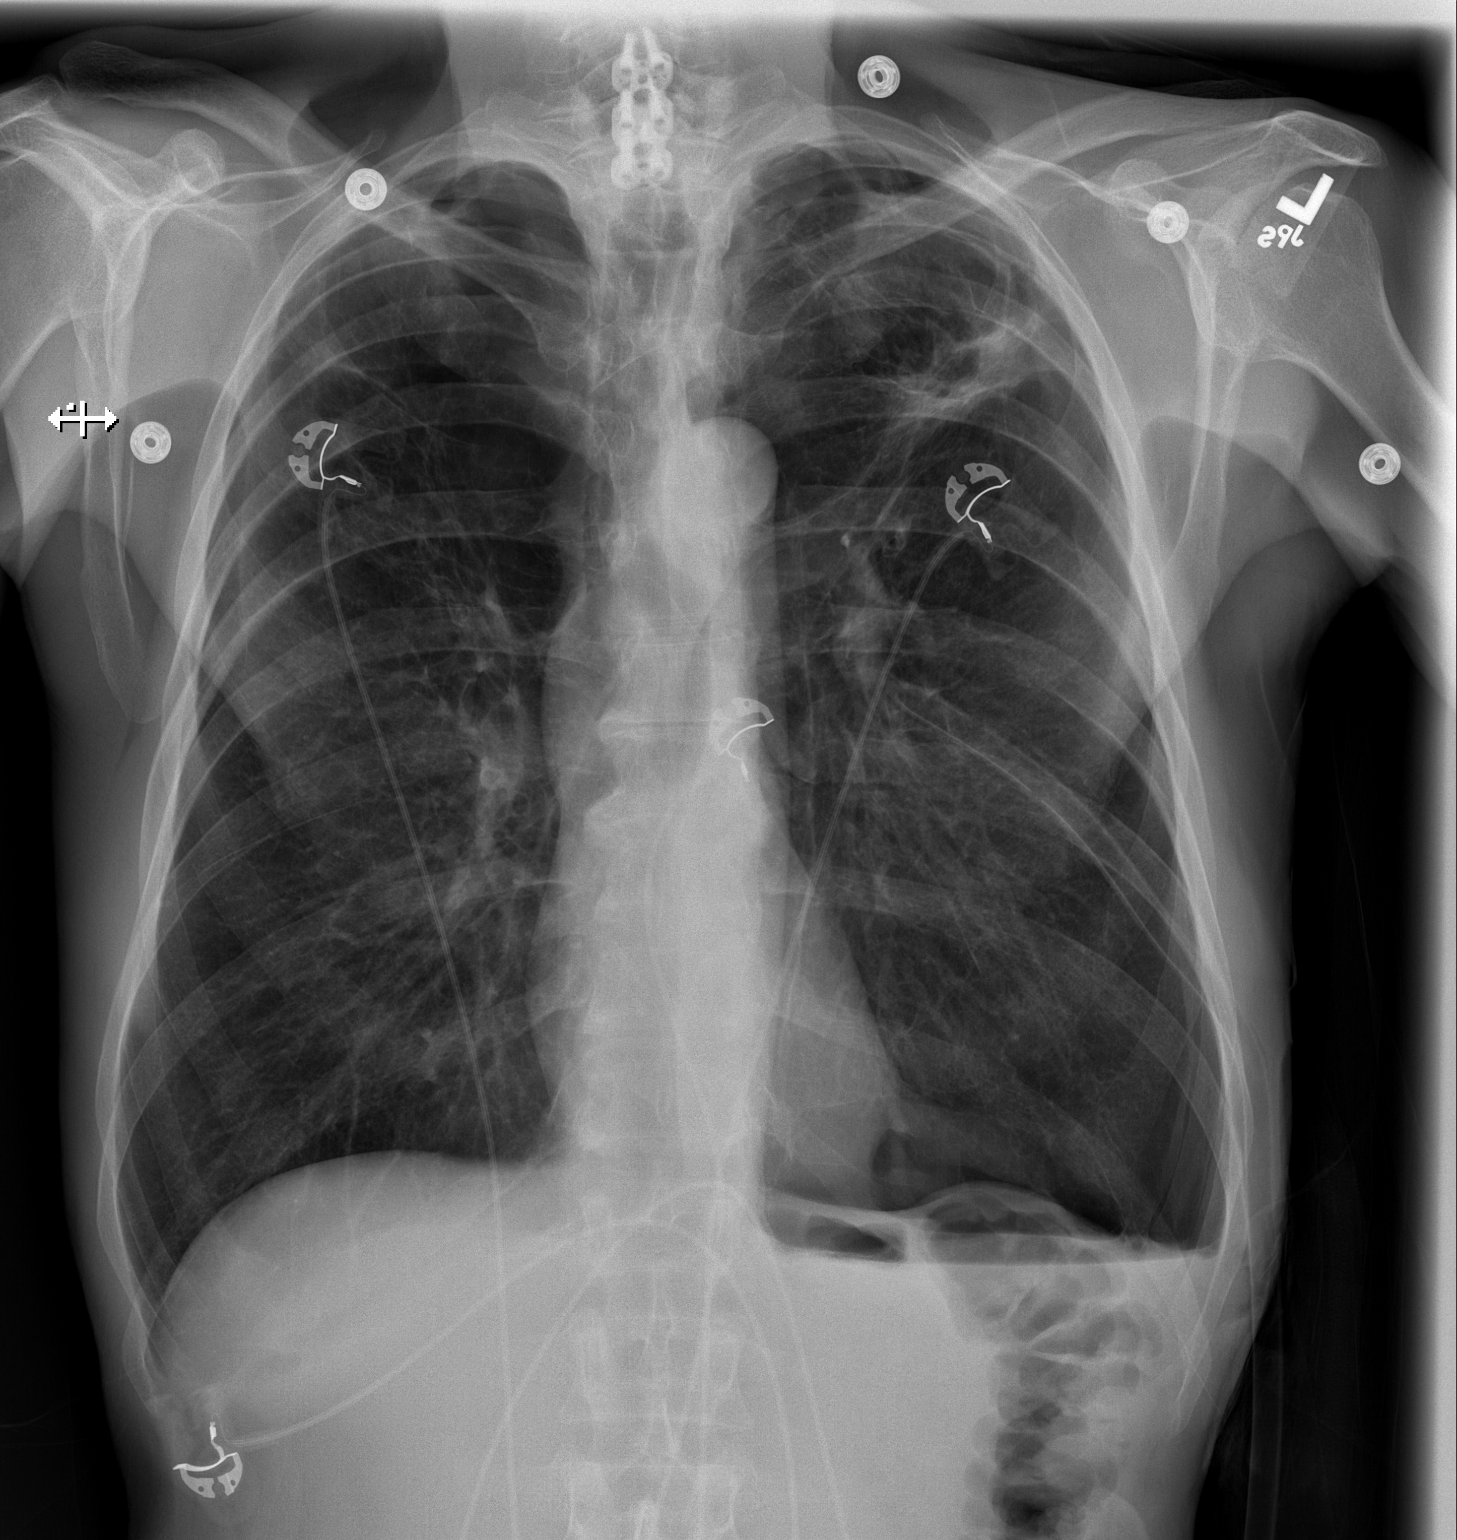

[w chest lat]
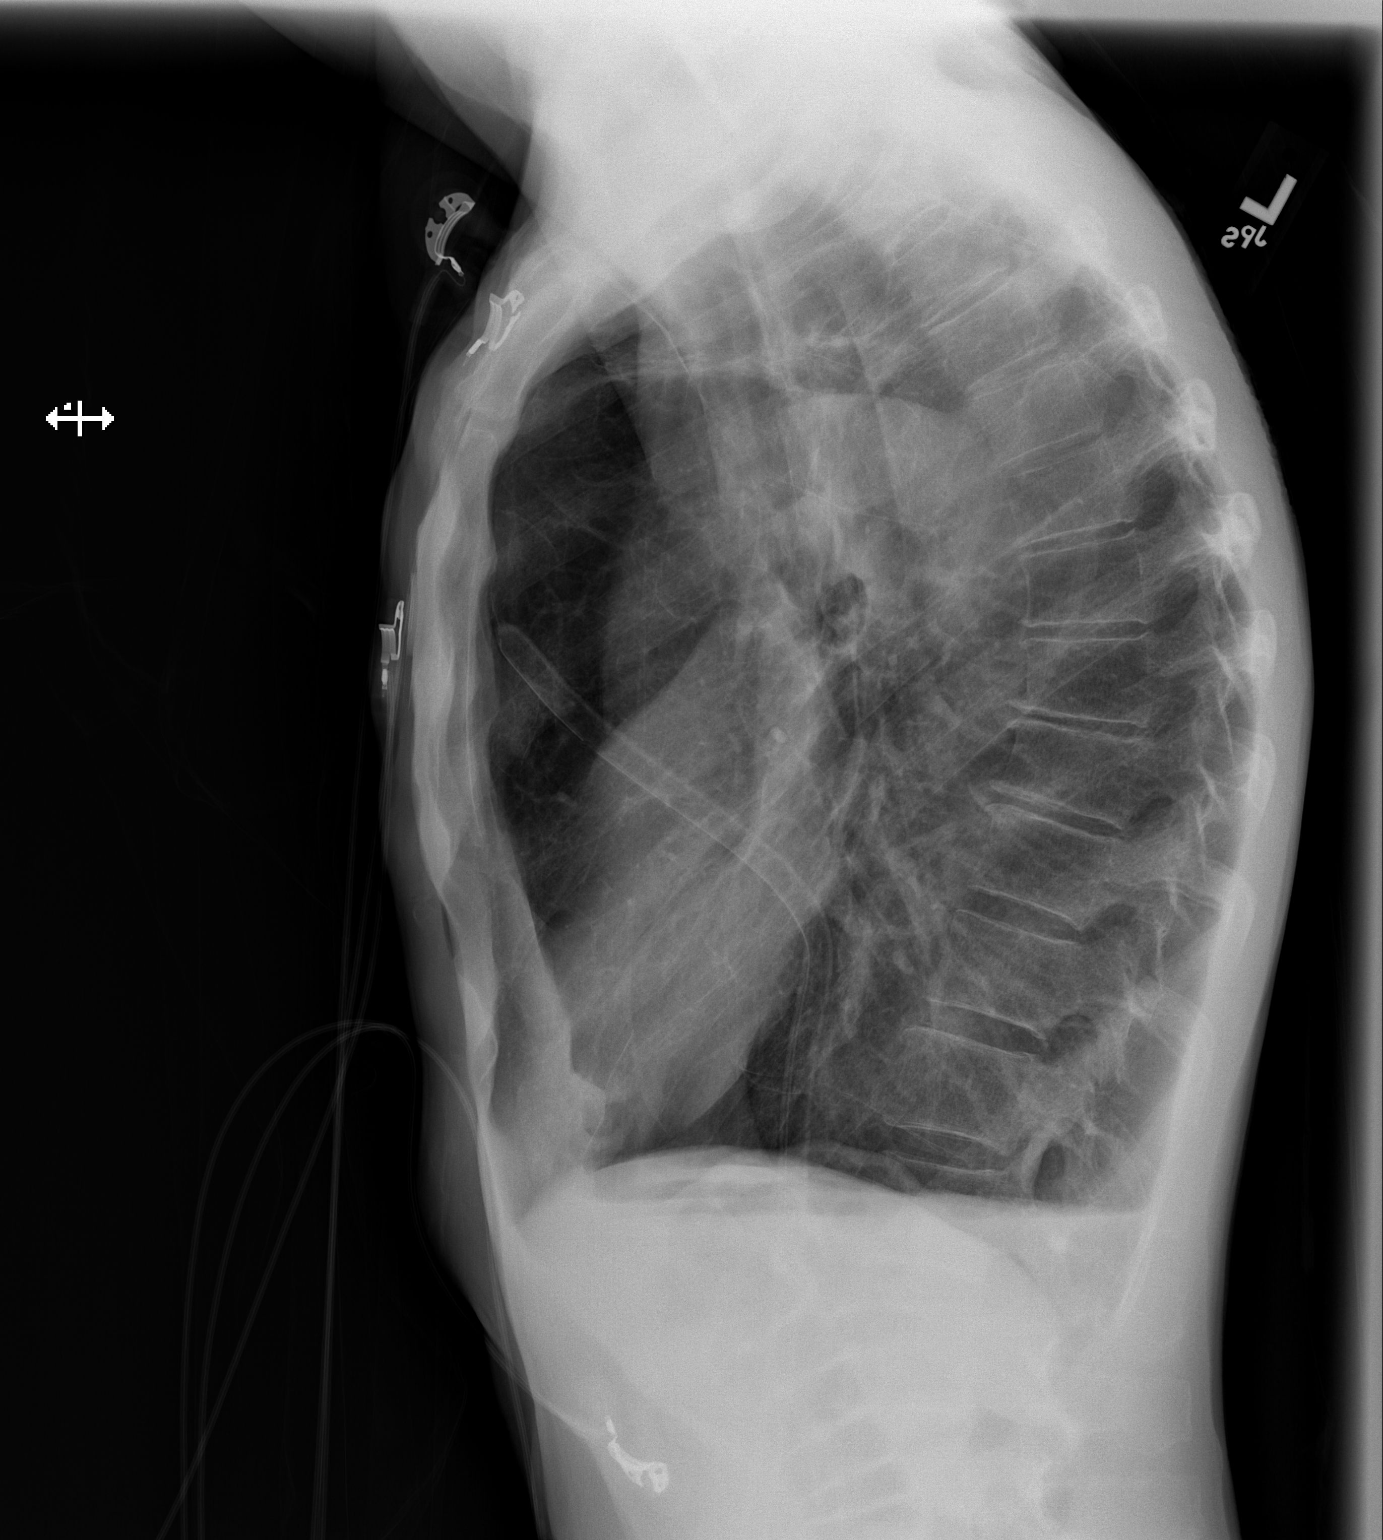

[2 of 2 positions shown; findings below may reference images not displayed]

FINDINGS: Cavitary lesion in the left upper lobe again noted with surrounding
fibrotic changes. Small left apical pneumothorax. Left-sided chest
tube in satisfactory position. Small left pleural effusion.
Bilateral emphysematous changes again noted. Right perihilar
fibrotic changes. Bilateral bullous disease. No focal consolidation.
Stable cardiomediastinal silhouette. No aggressive osseous lesion.
IMPRESSION: 1. Left-sided chest tube in satisfactory position. Persistent small
left hydropneumothorax.

## 2021-02-06 IMAGING — DX CHEST - 1 VIEW SAME DAY
1 series · 1 of 1 positions shown · non-contrast
Comparison: 8863 hours today and earlier.

CLINICAL DATA: 62-year-old male chest tube placement. Spontaneous
left pneumothorax.

EXAM:
CHEST - 1 VIEW SAME DAY

[chest]
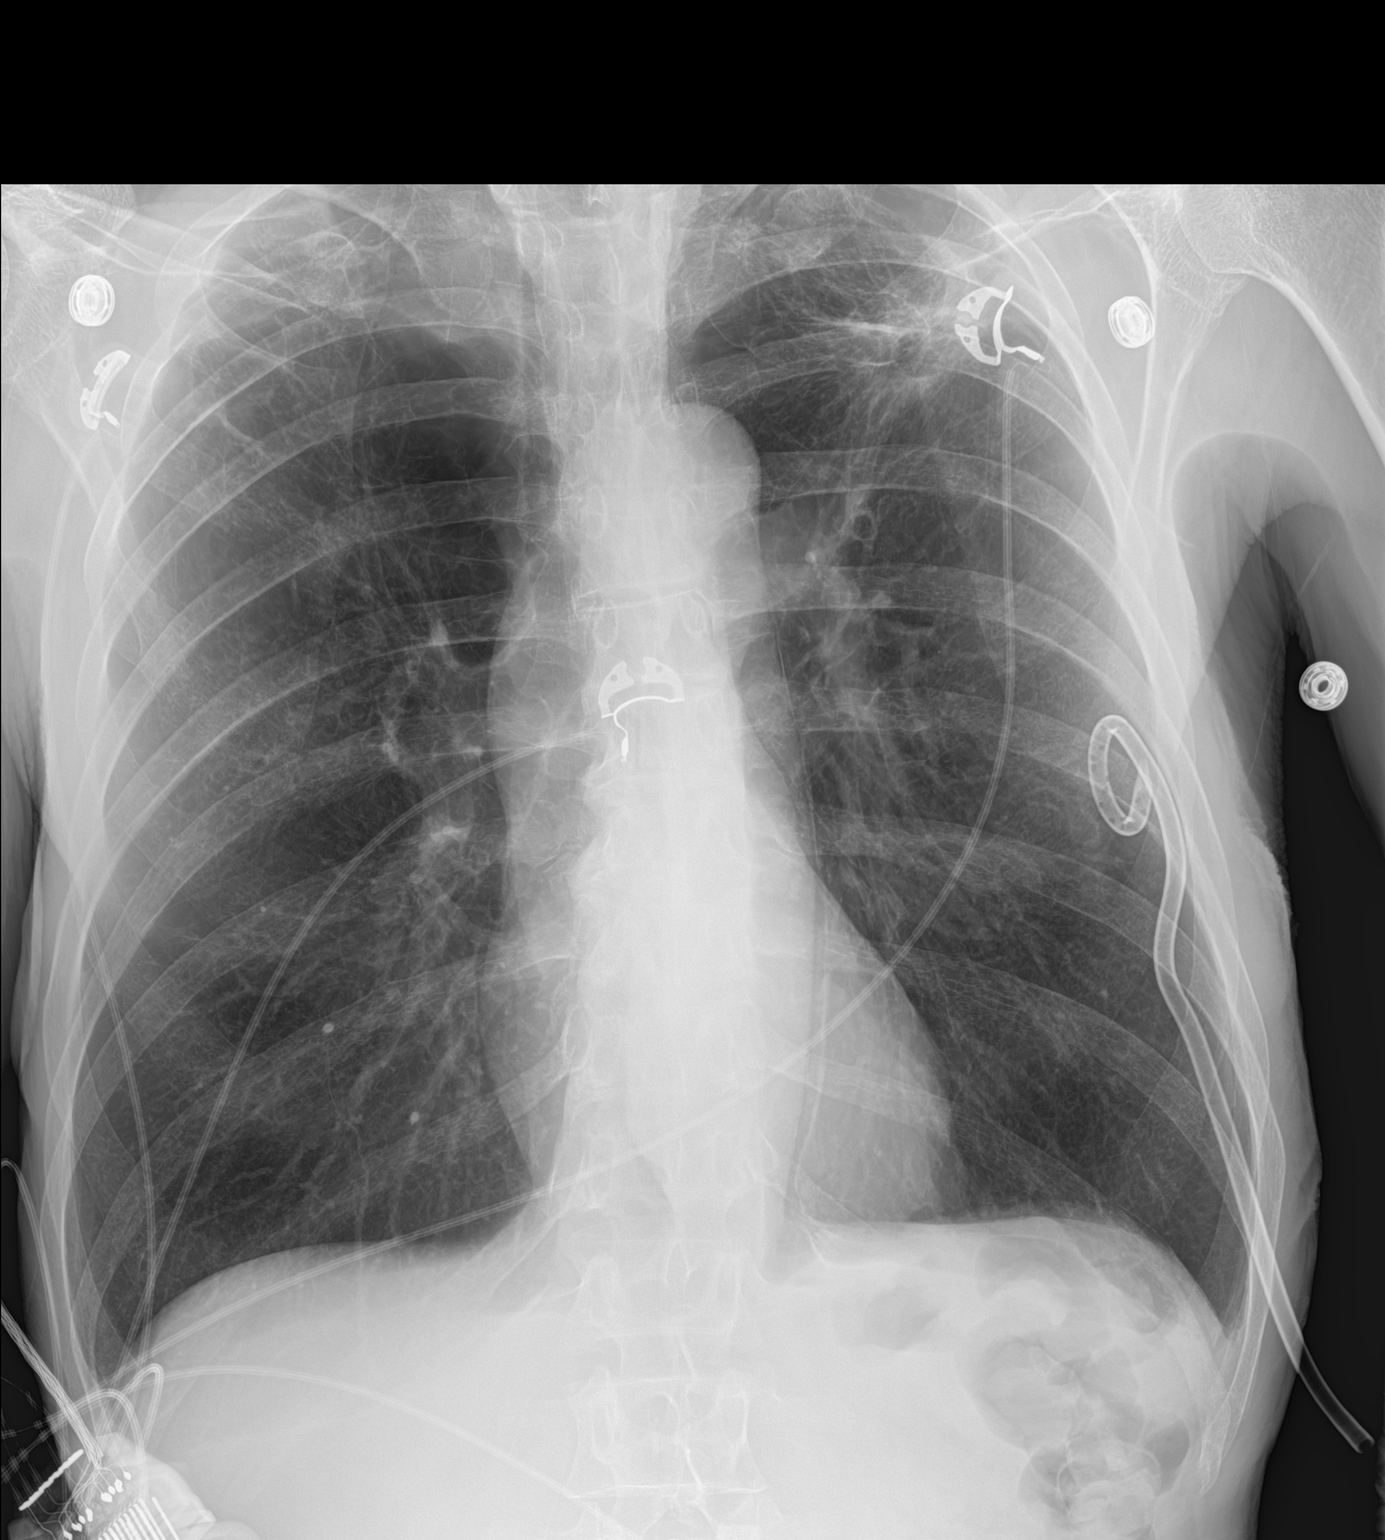

[1 of 1 positions shown; findings below may reference images not displayed]

FINDINGS: Portable AP upright view at 9917 hours. New pigtail type left chest
tube in place. Only trace residual pneumothorax is visible along the
lateral margin of the descending aorta. Underlying hyperinflation
and lung scarring with architectural distortion in the apices
greater on the left. No pleural effusion or acute pulmonary opacity.
Normal cardiac size and mediastinal contours. Visualized tracheal
air column is within normal limits. No acute osseous abnormality
identified. Prior cervical ACDF. Negative visible bowel gas pattern.
IMPRESSION: 1. Left chest tube placed with substantially reduced left
pneumothorax, trace residual.
2. Underlying chronic lung disease.

## 2021-02-07 IMAGING — DX PORTABLE CHEST - 1 VIEW
1 series · 1 of 1 positions shown · non-contrast
Comparison: Radiographs 04/09/2019 and 04/08/2019.  CT 04/06/2019.

CLINICAL DATA: Shortness of breath. Chest tube placement for
pneumothorax.

EXAM:
PORTABLE CHEST 1 VIEW

[chest ap]
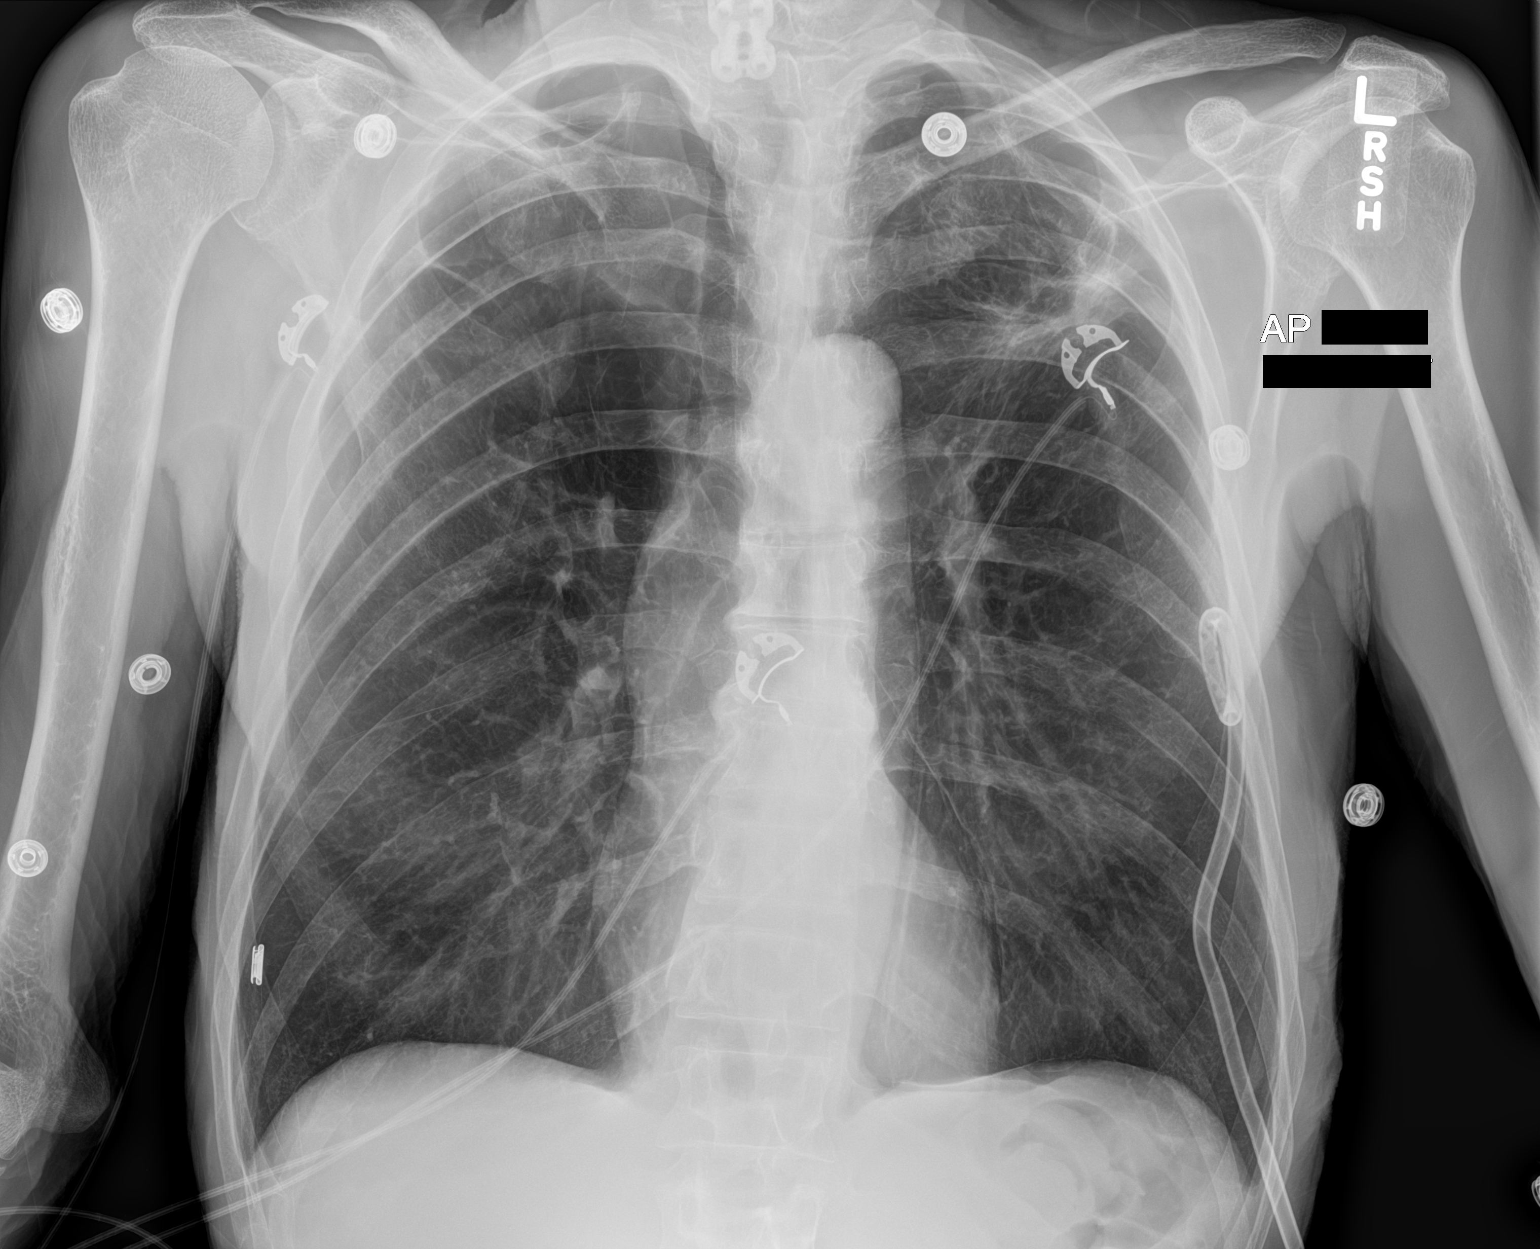

[1 of 1 positions shown; findings below may reference images not displayed]

FINDINGS: 5338 hours. Small caliber pigtail catheter peripherally in the left
hemithorax is unchanged in position. The residual left-sided
pneumothorax is unchanged with medial basilar and apical components.
There is no midline shift. The heart size and mediastinal contours
are stable. The lungs are hyperinflated with stable scarring in the
left upper lobe. No acute osseous findings are seen.
IMPRESSION: Unchanged small left residual pneumothorax following left chest tube
placement. No new findings.

## 2021-02-14 IMAGING — DX PORTABLE CHEST - 1 VIEW
1 series · 1 of 1 positions shown · non-contrast
Comparison: 04/17/2019, 04/16/2019, 04/15/2019, 04/14/2019,
04/13/2019

CLINICAL DATA: Follow-up pneumothorax, status post wedge resection
left upper lobe

EXAM:
PORTABLE CHEST 1 VIEW

[chest ap]
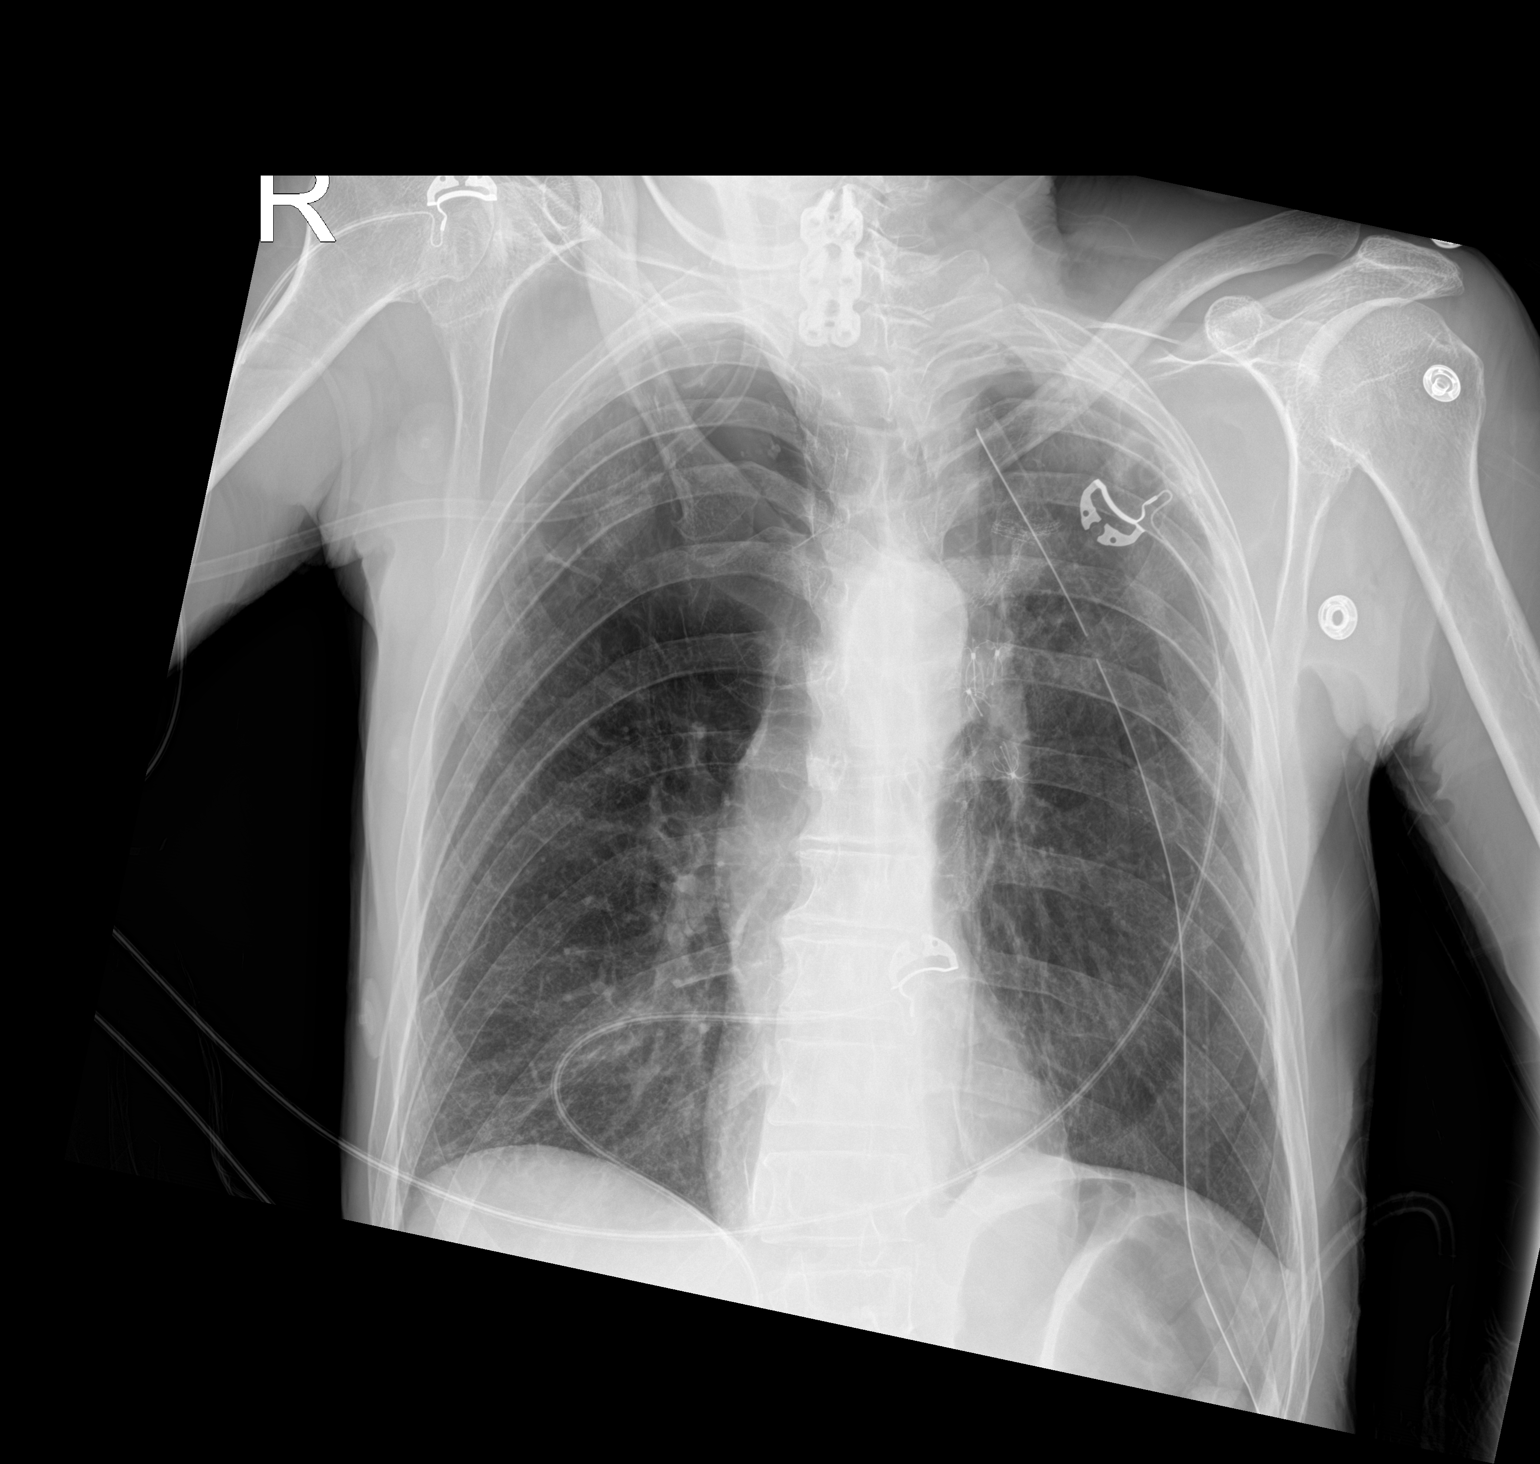

[1 of 1 positions shown; findings below may reference images not displayed]

FINDINGS: Postsurgical changes in the cervical spine. Left mid lung pigtail
drainage catheter has been removed. Interim insertion of left chest
tube with tip at the left apex. Resection of previously noted
spiculated opacity/cavitary lesion in the left upper lobe.
Postsurgical changes in the left hilar and suprahilar lung. Probable
small residual pneumothorax at the medial left lung base. No midline
shift. Normal heart size. Bullous emphysematous disease at the right
lung apex.
IMPRESSION: 1. Interval resection of previously noted spiculated cavitary left
upper lobe lesion with placement of new left chest tube with tip at
the left apex. Suspect small medial left lung base pneumothorax
2. Emphysematous disease with large bulla in the right upper lobe

## 2024-04-08 ENCOUNTER — Encounter (HOSPITAL_BASED_OUTPATIENT_CLINIC_OR_DEPARTMENT_OTHER): Payer: Self-pay | Admitting: Emergency Medicine

## 2024-04-08 ENCOUNTER — Other Ambulatory Visit: Payer: Self-pay

## 2024-04-08 ENCOUNTER — Emergency Department (HOSPITAL_BASED_OUTPATIENT_CLINIC_OR_DEPARTMENT_OTHER)
Admission: EM | Admit: 2024-04-08 | Discharge: 2024-04-08 | Disposition: A | Discharge status: Home / Self Care | Source: Ambulatory Visit

## 2024-04-08 ENCOUNTER — Emergency Department (HOSPITAL_BASED_OUTPATIENT_CLINIC_OR_DEPARTMENT_OTHER)

## 2024-04-08 DIAGNOSIS — R110 Nausea: Secondary | ICD-10-CM | POA: Diagnosis not present

## 2024-04-08 DIAGNOSIS — R1010 Upper abdominal pain, unspecified: Secondary | ICD-10-CM | POA: Insufficient documentation

## 2024-04-08 DIAGNOSIS — J449 Chronic obstructive pulmonary disease, unspecified: Secondary | ICD-10-CM | POA: Insufficient documentation

## 2024-04-08 DIAGNOSIS — Z85118 Personal history of other malignant neoplasm of bronchus and lung: Secondary | ICD-10-CM | POA: Diagnosis not present

## 2024-04-08 LAB — CBC WITH DIFFERENTIAL/PLATELET
Abs Immature Granulocytes: 0.02 K/uL (ref 0.00–0.07)
Basophils Absolute: 0 K/uL (ref 0.0–0.1)
Basophils Relative: 0 %
Eosinophils Absolute: 0.1 K/uL (ref 0.0–0.5)
Eosinophils Relative: 1 %
HCT: 39.8 % (ref 39.0–52.0)
Hemoglobin: 13.4 g/dL (ref 13.0–17.0)
Immature Granulocytes: 0 %
Lymphocytes Relative: 14 %
Lymphs Abs: 1.1 K/uL (ref 0.7–4.0)
MCH: 32.9 pg (ref 26.0–34.0)
MCHC: 33.7 g/dL (ref 30.0–36.0)
MCV: 97.8 fL (ref 80.0–100.0)
Monocytes Absolute: 0.6 K/uL (ref 0.1–1.0)
Monocytes Relative: 8 %
Neutro Abs: 6.2 K/uL (ref 1.7–7.7)
Neutrophils Relative %: 77 %
Platelets: 320 K/uL (ref 150–400)
RBC: 4.07 MIL/uL — ABNORMAL LOW (ref 4.22–5.81)
RDW: 12.8 % (ref 11.5–15.5)
WBC: 8.1 K/uL (ref 4.0–10.5)
nRBC: 0 % (ref 0.0–0.2)

## 2024-04-08 LAB — COMPREHENSIVE METABOLIC PANEL WITH GFR
ALT: 14 U/L (ref 0–44)
AST: 16 U/L (ref 15–41)
Albumin: 3.8 g/dL (ref 3.5–5.0)
Alkaline Phosphatase: 122 U/L (ref 38–126)
Anion gap: 10 (ref 5–15)
BUN: 10 mg/dL (ref 8–23)
CO2: 26 mmol/L (ref 22–32)
Calcium: 9.6 mg/dL (ref 8.9–10.3)
Chloride: 102 mmol/L (ref 98–111)
Creatinine, Ser: 0.76 mg/dL (ref 0.61–1.24)
GFR, Estimated: >60 mL/min (ref >60)
Glucose, Bld: 96 mg/dL (ref 70–99)
Potassium: 4.8 mmol/L (ref 3.5–5.1)
Sodium: 138 mmol/L (ref 135–145)
Total Bilirubin: <0.2 mg/dL (ref 0.0–1.2)
Total Protein: 7.3 g/dL (ref 6.5–8.1)

## 2024-04-08 LAB — LIPASE, BLOOD: Lipase: 30 U/L (ref 11–51)

## 2024-04-08 MED ORDER — IOHEXOL 300 MG/ML  SOLN
100.0000 mL | Freq: Once | INTRAMUSCULAR | Status: AC | PRN
  Administered 2024-04-08: 80 mL via INTRAVENOUS

## 2024-04-08 MED ORDER — HYOSCYAMINE SULFATE 0.125 MG SL SUBL
0.2500 mg | SUBLINGUAL_TABLET | Freq: Once | SUBLINGUAL | Status: AC
  Administered 2024-04-08: 0.25 mg via SUBLINGUAL
  Filled 2024-04-08: qty 2

## 2024-04-08 MED ORDER — PANTOPRAZOLE SODIUM 20 MG PO TBEC
20.0000 mg | DELAYED_RELEASE_TABLET | Freq: Every day | ORAL | 0 refills | Status: AC
Start: 2024-04-08 — End: ?

## 2024-04-08 NOTE — ED Provider Notes (Signed)
 [place] EMERGENCY DEPARTMENT AT [facility] Provider Note   CSN: [id number] Arrival date & time: 04/08/24  9080     Patient presents with: Abdominal Pain   Dillon Olson is a 68 y.o. male.   68 year old male with past medical history of lung cancer and COPD presenting to the emergency department today with abdominal pain.  The patient states he has been having abdominal pain now over the past month or 2.  Reports this is relatively constant in the upper abdomen.  He has had nausea but denies any associated vomiting.  He has been taking over-the-counter and acids with minimal to no improvement.  He came to the ER today for further evaluation.  Reports normal bowel movement.  Denies any blood in stool or dark stool or hematemesis.  He went to see his primary care doctor today and was sent in for further evaluation and CT scanning.   Abdominal Pain      Prior to Admission medications   Medication Sig Start Date End Date Taking? Authorizing Provider  pantoprazole  (PROTONIX ) 20 MG tablet Take 1 tablet (20 mg total) by mouth daily. 04/08/24  Yes Ula Prentice SAUNDERS, MD  cyclobenzaprine  (FLEXERIL ) 5 MG tablet Take 5 mg by mouth daily as needed for muscle spasms.     [provider]  ENSURE PLUS (ENSURE PLUS) LIQD Take 237 mLs by mouth 2 (two) times daily between meals.  Patient not taking: Reported on 03/17/2020    [provider]  megestrol  (MEGACE ) 20 MG tablet Take 20 mg by mouth daily. Patient not taking: Reported on 03/17/2020    [provider]  ondansetron  (ZOFRAN ) 4 MG tablet Take 1 tablet (4 mg total) by mouth every 6 (six) hours. 03/17/20   Rolan Burnard LABOR, PA-C  oxyCODONE-acetaminophen  (PERCOCET) 10-325 MG tablet Take 1 tablet by mouth every 6 (six) hours.     [provider]    Allergies: Advil [ibuprofen] and Codeine    Review of Systems  Gastrointestinal:  Positive for abdominal pain.  All other systems reviewed and are  negative.   Updated Vital Signs BP 114/80   Pulse (!) 56   Temp 98 F (36.7 C) (Oral)   Resp 16   SpO2 99%   Physical Exam Vitals and nursing note reviewed.   Gen: NAD Eyes: PERRL, EOMI HEENT: no oropharyngeal swelling Neck: trachea midline Resp: clear to auscultation bilaterally Card: RRR, no murmurs, rubs, or gallops Abd: The patient is tender over the right upper quadrant, epigastrium, left upper quadrant, and periumbilical region..  No lower abdominal tenderness noted Extremities: no calf tenderness, no edema Vascular: 2+ radial pulses bilaterally, 2+ DP pulses bilaterally Skin: no rashes Psyc: acting appropriately   (all labs ordered are listed, but only abnormal results are displayed) Labs Reviewed  CBC WITH DIFFERENTIAL/PLATELET - Abnormal; Notable for the following components:      Result Value   RBC 4.07 (*)    All other components within normal limits  COMPREHENSIVE METABOLIC PANEL WITH GFR  LIPASE, BLOOD    EKG: None  Radiology: CT ABDOMEN PELVIS W CONTRAST Result Date: 04/08/2024 CLINICAL DATA:  Nausea and reflux for a month. EXAM: CT ABDOMEN AND PELVIS WITH CONTRAST TECHNIQUE: Multidetector CT imaging of the abdomen and pelvis was performed using the standard protocol following bolus administration of intravenous contrast. RADIATION DOSE REDUCTION: This exam was performed according to the departmental dose-optimization program which includes automated exposure control, adjustment of the mA and/or kV according to patient  size and/or use of iterative reconstruction technique. CONTRAST:  80mL OMNIPAQUE  IOHEXOL  300 MG/ML  SOLN COMPARISON:  CT 03/17/2020 FINDINGS: Lower chest: Lung bases are grossly clear. Advanced emphysematous lung changes. No pleural effusion. Hepatobiliary: Few tiny cystic foci identified, similar distribution to previous. Likely benign. No specific imaging follow-up. Patent portal vein. Gallbladder is present. Pancreas: Unremarkable. No  pancreatic ductal dilatation or surrounding inflammatory changes. Spleen: Normal in size without focal abnormality. Adrenals/Urinary Tract: Adrenal glands are unremarkable. Kidneys are normal, without renal calculi, focal lesion, or hydronephrosis. Bladder is poorly distended. Stomach/Bowel: There is a redundant course to the transverse colon. The large bowel is nondilated moderate diffuse colonic stool. Sigmoid colon has some diverticula. Appendix is not well seen in the right lower quadrant. Possible surgical changes in this location. Please correlate with history. Small bowel is nondilated. Stomach is underdistended. Vascular/Lymphatic: Extensive vascular calcifications identified. 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 There is reconstitution along the right external iliac vessel. The internal iliac artery appears occluded as well. Please correlate with known history. Normal caliber aorta and IVC. No specific abnormal lymph node enlargement identified in the abdomen and pelvis. Reproductive: Heterogeneous prostate with some calcifications Other: No free air or free fluid. Musculoskeletal: Mild degenerative changes along the spine and pelvis. Calcifications along the discs at L4-5 and L5-S1 with some disc bulging. IMPRESSION: No bowel obstruction. Diffuse colonic stool. Left-sided colonic diverticula. 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 Significant disease along the left common iliac as well. Please correlate with history and symptoms. Electronically Signed   By: Ranell Bring M.D.   On: 04/08/2024 13:10     Procedures   Medications Ordered in the ED  hyoscyamine  (LEVSIN  SL) SL tablet 0.25 mg (0.25 mg Sublingual Given 04/08/24 1042)  iohexol  (OMNIPAQUE ) 300 MG/ML solution 100 mL (80 mLs Intravenous Contrast Given 04/08/24 1150)                                    Medical Decision Making 68 year old male  with past medical history of lung cancer and COPD presenting to the emergency department today with abdominal pain.  I will further evaluate the patient here with basic labs including LFTs and lipase to evaluate for hepatobiliary pathology or pancreatitis.  Will obtain a CT scan for further evaluation for diverticulitis, colitis, obstruction, malignancy, or other intra-abdominal pathology.  Will give patient GI cocktail here to see if this helps with his symptoms and reevaluate for ultimate disposition.  The patient CT scan is unremarkable.  There are some vascular calcifications in the iliac arteries but the patient does not have any symptoms consistent with this.  He is discharged with GI follow-up with return precautions.  Amount and/or Complexity of Data Reviewed Labs: ordered. Radiology: ordered.  Risk Prescription drug management.        Final diagnoses:  Upper abdominal pain    ED Discharge Orders          Ordered    pantoprazole  (PROTONIX ) 20 MG tablet  Daily        04/08/24 1319               Ula Prentice SAUNDERS, MD 04/08/24 1321

## 2024-04-08 NOTE — Discharge Instructions (Signed)
 Your CT scan did not show any concerning findings in regards to your abdominal pain.  Please follow-up with the GI doctor at the number provided.  Take the Protonix  daily.  You can stop your other antacid medication at home until you follow-up with the GI doctor.  Return to the emergency department for worsening symptoms.

## 2024-04-08 NOTE — ED Triage Notes (Signed)
 C/o nausea and reflux x 1 month. Sent by PCP for CT w/ contrast.
